# Patient Record
Sex: Female | Born: 1975
Health system: Southern US, Community
[De-identification: ages and names within clinical notes are randomized; demographics above are authoritative.]

## PROBLEM LIST (undated history)

## (undated) DIAGNOSIS — D219 Benign neoplasm of connective and other soft tissue, unspecified: Secondary | ICD-10-CM

## (undated) DIAGNOSIS — O139 Gestational [pregnancy-induced] hypertension without significant proteinuria, unspecified trimester: Secondary | ICD-10-CM

## (undated) DIAGNOSIS — D649 Anemia, unspecified: Secondary | ICD-10-CM

## (undated) DIAGNOSIS — R569 Unspecified convulsions: Secondary | ICD-10-CM

## (undated) HISTORY — PX: TONSILLECTOMY: SUR1361

---

## 2006-07-22 ENCOUNTER — Emergency Department (HOSPITAL_COMMUNITY): Admission: EM | Admit: 2006-07-22 | Discharge: 2006-07-22 | Payer: Self-pay | Admitting: Emergency Medicine

## 2006-09-13 ENCOUNTER — Emergency Department (HOSPITAL_COMMUNITY): Admission: EM | Admit: 2006-09-13 | Discharge: 2006-09-13 | Payer: Self-pay | Admitting: Emergency Medicine

## 2006-12-13 ENCOUNTER — Emergency Department (HOSPITAL_COMMUNITY): Admission: EM | Admit: 2006-12-13 | Discharge: 2006-12-13 | Payer: Self-pay | Admitting: Emergency Medicine

## 2007-02-04 ENCOUNTER — Inpatient Hospital Stay (HOSPITAL_COMMUNITY): Admission: AD | Admit: 2007-02-04 | Discharge: 2007-02-06 | Payer: Self-pay | Admitting: Obstetrics & Gynecology

## 2007-03-18 ENCOUNTER — Emergency Department (HOSPITAL_COMMUNITY): Admission: EM | Admit: 2007-03-18 | Discharge: 2007-03-18 | Payer: Self-pay | Admitting: Emergency Medicine

## 2008-02-09 ENCOUNTER — Emergency Department (HOSPITAL_COMMUNITY): Admission: EM | Admit: 2008-02-09 | Discharge: 2008-02-09 | Payer: Self-pay | Admitting: Emergency Medicine

## 2009-05-16 ENCOUNTER — Inpatient Hospital Stay (HOSPITAL_COMMUNITY): Admission: AD | Admit: 2009-05-16 | Discharge: 2009-05-17 | Payer: Self-pay | Admitting: Obstetrics and Gynecology

## 2009-07-28 ENCOUNTER — Inpatient Hospital Stay (HOSPITAL_COMMUNITY): Admission: AD | Admit: 2009-07-28 | Discharge: 2009-08-01 | Payer: Self-pay | Admitting: Obstetrics & Gynecology

## 2009-09-07 ENCOUNTER — Inpatient Hospital Stay (HOSPITAL_COMMUNITY): Admission: RE | Admit: 2009-09-07 | Discharge: 2009-09-09 | Payer: Self-pay | Admitting: Obstetrics and Gynecology

## 2009-09-07 ENCOUNTER — Encounter (INDEPENDENT_AMBULATORY_CARE_PROVIDER_SITE_OTHER): Payer: Self-pay | Admitting: Obstetrics and Gynecology

## 2010-04-09 ENCOUNTER — Other Ambulatory Visit: Payer: Self-pay | Admitting: Obstetrics and Gynecology

## 2010-04-26 NOTE — Discharge Summary (Signed)
  NAMECRESCENTIA, [name]           ACCOUNT NO.:  [id number]  MEDICAL RECORD NO.:  [id number]         PATIENT TYPE:  WINP  LOCATION:                                FACILITY:  [facility]  PHYSICIAN:  Malva Limes, M.D.    DATE OF BIRTH:  1975-04-20  DATE OF ADMISSION: DATE OF DISCHARGE:                              DISCHARGE SUMMARY   PRINCIPLE DISCHARGE DIAGNOSES: 1. Intrauterine pregnancy at 30 weeks estimated gestational age. 2. Bleeding placenta previa. 3. Seizure disorder.  PRINCIPAL PROCEDURES: 1. Neurology consult. 2. Magnesium sulfate tocolysis.  HISTORY OF PRESENT ILLNESS:  Jo [name] is a 35 year old black female G3, P 2-0-0 2 at 30 weeks who was brought in by EMS complaining of vaginal bleeding.  The patient had a known complete posterior previa.  On admission the patient had no heavy bleeding.  She was admitted and given betamethasone.  During the admission, the patient was noted to have a seizure.  She did have a known seizure disorder, had been off of medications since October 2010.  The patient did have a Neurology consult while being hospitalized.  The patient was discharged to home on hospital day #5, at that time she has had no bleeding for several days. She is currently on Procardia 10 mg p.o. q.6 h.  She was instructed to be on bedrest and call the office with any bleeding.  She is to return to the office in 1 week.          ______________________________ Malva Limes, M.D.     MA/MEDQ  D:  04/19/2010  T:  04/20/2010  Job:  [id number]  Electronically Signed by Malva Limes M.D. on 04/26/2010 12:35:20 PM

## 2010-04-26 NOTE — Discharge Summary (Signed)
  [name], Jo [name]           ACCOUNT NO.:  [id number]  MEDICAL RECORD NO.:  [id number]          PATIENT TYPE:  INP  LOCATION:  9136                          FACILITY:  [facility]  PHYSICIAN:  Malva Limes, M.D.    DATE OF BIRTH:  03/11/1975  DATE OF ADMISSION:  09/07/2009 DATE OF DISCHARGE:  09/09/2009                              DISCHARGE SUMMARY   PRINCIPAL DISCHARGE DIAGNOSES: 1. Intrauterine pregnancy at 25 weeks' estimated gestational age. 2. Complete posterior placenta previa. 3. Homero Fellers breech presentation. 4. Vaginal bleeding. 5. Seizure.  PRINCIPAL PROCEDURES: 1. Magnesium tocolysis. 2. Neuro consult. 3. Bedrest. 4. Administration of betamethasone.  HISTORY OF PRESENT ILLNESS:  [name] is a 35 year old African American female G3, P2-0-0-2 at 30-1/2 weeks' estimated gestational age who presented complaining of painless vaginal bleeding which began approximately 2:30 a.m.  The patient stated that she had a big gush of blood noticed in the toilet.  The patient was brought to the hospital by EMS.  In the emergency room, the patient was noted to have small amount of bleeding.  One week prior to hospitalization, she did have an ultrasound at which time she was noted to still have a placenta previa. The patient was admitted and given betamethasone.  On July 28, 2009, at 7 a.m., the patient's husband noticed a seizure.  The patient does have a history of seizure disorder, had been off her Topamax since October 2010.  The patient did have a neuro consult while in the hospital and it was not recommended that the patient restarted Topamax.  The patient was observed until August 01, 2009, at which time, she had no vaginal bleeding for several days and no contractions.  The patient had a strong desire to go home; therefore, she was discharged to home.  She was instructed to followup in the office in 1 week.  She was sent home with Procardia 10 mg p.o. 6 h. and told to have bedrest  with bathroom privileges only.          ______________________________ Malva Limes, M.D.     MA/MEDQ  D:  09/14/2009  T:  09/15/2009  Job:  [id number]  Electronically Signed by Malva Limes M.D. on 04/26/2010 12:35:18 PM

## 2010-05-12 LAB — CBC
HCT: 24.5 % — ABNORMAL LOW (ref 36.0–46.0)
MCH: 32 pg (ref 26.0–34.0)
MCHC: 34 g/dL (ref 30.0–36.0)
MCV: 94.1 fL (ref 78.0–100.0)
WBC: 13.1 10*3/uL — ABNORMAL HIGH (ref 4.0–10.5)

## 2010-05-13 LAB — CROSSMATCH
ABO/RH(D): O POS
Antibody Screen: NEGATIVE

## 2010-05-13 LAB — BASIC METABOLIC PANEL
CO2: 23 mEq/L (ref 19–32)
Creatinine, Ser: 0.73 mg/dL (ref 0.4–1.2)
GFR calc non Af Amer: >60 mL/min (ref >60)
Glucose, Bld: 78 mg/dL (ref 70–99)
Sodium: 134 mEq/L — ABNORMAL LOW (ref 135–145)

## 2010-05-13 LAB — CBC
HCT: 34.3 % — ABNORMAL LOW (ref 36.0–46.0)
Hemoglobin: 11.3 g/dL — ABNORMAL LOW (ref 12.0–15.0)
MCV: 94.4 fL (ref 78.0–100.0)
Platelets: 233 10*3/uL (ref 150–400)
RBC: 3.63 MIL/uL — ABNORMAL LOW (ref 3.87–5.11)
RDW: 14.7 % (ref 11.5–15.5)
WBC: 8 10*3/uL (ref 4.0–10.5)

## 2010-05-13 LAB — RPR: RPR Ser Ql: NONREACTIVE

## 2010-05-13 LAB — URINALYSIS, ROUTINE W REFLEX MICROSCOPIC
Bilirubin Urine: NEGATIVE
Nitrite: NEGATIVE
Specific Gravity, Urine: 1.015 (ref 1.005–1.030)

## 2010-05-13 LAB — SURGICAL PCR SCREEN
MRSA, PCR: NEGATIVE
Staphylococcus aureus: NEGATIVE

## 2010-05-14 LAB — PREPARE RBC (CROSSMATCH)

## 2010-05-14 LAB — COMPREHENSIVE METABOLIC PANEL
AST: 14 U/L (ref 0–37)
Alkaline Phosphatase: 43 U/L (ref 39–117)
CO2: 20 mEq/L (ref 19–32)
GFR calc Af Amer: >60 mL/min (ref >60)
GFR calc non Af Amer: >60 mL/min (ref >60)
Glucose, Bld: 89 mg/dL (ref 70–99)
Total Bilirubin: 0.3 mg/dL (ref 0.3–1.2)
Total Protein: 4.8 g/dL — ABNORMAL LOW (ref 6.0–8.3)

## 2010-05-14 LAB — TYPE AND SCREEN: Antibody Screen: NEGATIVE

## 2010-05-14 LAB — URINALYSIS, DIPSTICK ONLY
Bilirubin Urine: NEGATIVE
Glucose, UA: NEGATIVE mg/dL
Nitrite: NEGATIVE
Protein, ur: NEGATIVE mg/dL
Specific Gravity, Urine: 1.025 (ref 1.005–1.030)
pH: 6 (ref 5.0–8.0)

## 2010-05-14 LAB — CBC
Hemoglobin: 9.9 g/dL — ABNORMAL LOW (ref 12.0–15.0)
MCHC: 34.7 g/dL (ref 30.0–36.0)
MCV: 94 fL (ref 78.0–100.0)
RBC: 3.09 MIL/uL — ABNORMAL LOW (ref 3.87–5.11)
RBC: 3.14 MIL/uL — ABNORMAL LOW (ref 3.87–5.11)
RDW: 14.7 % (ref 11.5–15.5)
WBC: 11.6 10*3/uL — ABNORMAL HIGH (ref 4.0–10.5)
WBC: 7.5 10*3/uL (ref 4.0–10.5)

## 2010-05-14 LAB — ABO/RH: ABO/RH(D): O POS

## 2010-05-20 LAB — COMPREHENSIVE METABOLIC PANEL
ALT: 20 U/L (ref 0–35)
AST: 26 U/L (ref 0–37)
Albumin: 2.6 g/dL — ABNORMAL LOW (ref 3.5–5.2)
Alkaline Phosphatase: 55 U/L (ref 39–117)
BUN: 9 mg/dL (ref 6–23)
CO2: 21 mEq/L (ref 19–32)
Calcium: 8.3 mg/dL — ABNORMAL LOW (ref 8.4–10.5)
Calcium: 8.8 mg/dL (ref 8.4–10.5)
Creatinine, Ser: 0.7 mg/dL (ref 0.4–1.2)
GFR calc Af Amer: >60 mL/min (ref >60)
GFR calc Af Amer: >60 mL/min (ref >60)
Glucose, Bld: 82 mg/dL (ref 70–99)
Potassium: 4 mEq/L (ref 3.5–5.1)
Sodium: 135 mEq/L (ref 135–145)
Total Protein: 5.9 g/dL — ABNORMAL LOW (ref 6.0–8.3)

## 2010-05-20 LAB — URINALYSIS, ROUTINE W REFLEX MICROSCOPIC
Glucose, UA: NEGATIVE mg/dL
Ketones, ur: >80 mg/dL — AB
Leukocytes, UA: NEGATIVE
Protein, ur: 30 mg/dL — AB
Urobilinogen, UA: 0.2 mg/dL (ref 0.0–1.0)

## 2010-05-20 LAB — OVA AND PARASITE EXAMINATION: Ova and parasites: NONE SEEN

## 2010-05-20 LAB — DIFFERENTIAL
Basophils Absolute: 0 10*3/uL (ref 0.0–0.1)
Basophils Relative: 0 % (ref 0–1)
Eosinophils Relative: 0 % (ref 0–5)
Monocytes Relative: 1 % — ABNORMAL LOW (ref 3–12)
Neutrophils Relative %: 94 % — ABNORMAL HIGH (ref 43–77)

## 2010-05-20 LAB — CLOSTRIDIUM DIFFICILE EIA: C difficile Toxins A+B, EIA: 23

## 2010-05-20 LAB — STOOL CULTURE

## 2010-05-20 LAB — CBC
HCT: 40.3 % (ref 36.0–46.0)
Hemoglobin: 13.1 g/dL (ref 12.0–15.0)
MCHC: 32.6 g/dL (ref 30.0–36.0)
MCHC: 34.2 g/dL (ref 30.0–36.0)
Platelets: 168 10*3/uL (ref 150–400)
RBC: 4.6 MIL/uL (ref 3.87–5.11)
RDW: 18.5 % — ABNORMAL HIGH (ref 11.5–15.5)
RDW: 18.5 % — ABNORMAL HIGH (ref 11.5–15.5)

## 2010-05-20 LAB — LACTATE DEHYDROGENASE: LDH: 91 U/L — ABNORMAL LOW (ref 94–250)

## 2010-05-31 NOTE — Discharge Summary (Signed)
  NAMEJulien Girt            ACCOUNT NO.:  [id number]  MEDICAL RECORD NO.:  [id number]           PATIENT TYPE:  LOCATION:                                 FACILITY:  PHYSICIAN:  Malva Limes, M.D.         DATE OF BIRTH:  DATE OF ADMISSION:  09/07/2009 DATE OF DISCHARGE:  09/09/2009                              DISCHARGE SUMMARY   PRINCIPAL DISCHARGE DIAGNOSES: 1. Intrauterine pregnancy at term. 2. Complete placenta previa.  PRINCIPAL PROCEDURES:  Primary low transverse cesarean section.  HOSPITAL COURSE:  Ms. Jo [name] is a 35 year old black female, G3, P 2-0- 0-2 at 54 and 1 day estimated gestational age, who was admitted by Dr. Conley Simmonds for primary cesarean section secondary to placenta previa with 2 previous bleeding episodes.  The patient did receive betamethasone after the second bleeding episode.  In light of this, it was decided to proceed with the cesarean section at an earlier time. The patient underwent a cesarean section on September 07, 2009.  A complete description of this can be found in dictated operative note.  On the evening of the cesarean section, there is a note by the nursing service stating that the patient had a 30-second seizure that states that the physician was called, however, there is no indication that the patient was evaluated or any orders were given.  On postop day #1, the patient had no complaints.  She had normal lochia.  She was ambulate without difficulty.  The patient asked to go home on postoperative day #2.  She was discharged to home.  At that time, her incision was healing well. She was ambulating without difficulty.  She had no further seizure activity.  She was discharged to home with iron and Percocet.  She was instructed to follow up in the office in 4 weeks.  I am unaware of why it took 10 months to get this chart for dictation.  The patient was also instructed to follow up with a neurologist.     ______________________________ Malva Limes, M.D.     MA/MEDQ  D:  05/19/2010  T:  05/20/2010  Job:  [id number]  Electronically Signed by Malva Limes M.D. on 05/31/2010 08:52:46 AM

## 2010-07-10 NOTE — Discharge Summary (Signed)
NAMEKAMRI, GOTSCH           ACCOUNT NO.:  [id number]   MEDICAL RECORD NO.:  [id number]          PATIENT TYPE:  INP   LOCATION:  9111                          FACILITY:  [facility]   PHYSICIAN:  Gerrit Friends. Aldona Bar, M.D.   DATE OF BIRTH:  03/06/1975   DATE OF ADMISSION:  02/04/2007  DATE OF DISCHARGE:  02/06/2007                               DISCHARGE SUMMARY   DISCHARGE DIAGNOSES:  1. Term pregnancy, delivered a 5 pound, 10 ounce female infant, Apgars 9      and 9.  2. Positive group B strep antenatally.  3. History of migraine-induced seizures - maintained on Topamax  4. Blood type is O+.   PROCEDURES:  1. Normal spontaneous delivery.  2. Superficial tears and repair.   SUMMARY:  This 35 year old gravida 2, now para 2 was admitted at term in  labor.  On the day prior to admission, in the office her pressure was  noted to be slightly elevated.  Labs at that time were within normal  limits.  After presentation at approximately 3:00 a.m. on the morning of  December 10, she was noted to be progressing rapidly.  She received  intravenous ampicillin for group B strep prophylaxis and after membranes  were ruptured progressed to full dilatation rather rapidly and delivered  a viable 5 pound, 10 ounce female infant with Apgars of 9 and 9.  There  was a small supraurethral laceration which was repaired without  difficulty, and the mother's postpartum course otherwise was  unremarkable.  Her postpartum blood pressure was normal.  Her postpartum  hemoglobin was 10.8 with a white count of 13,400, platelet count of  169,000.  On the morning of December 12, she was ambulating well,  tolerating a regular diet well, having normal bowel and bladder  function, was afebrile, was requiring only minimal ibuprofen for  discomfort, was breast-feeding without difficulty and was desirous of  discharge.  Accordingly, she was given all appropriate instructions and  understood all instructions well.  Discharge  medications include  vitamins - one a day as long she is breast-feeding and she will use  Feosol capsules - one daily until her followup visit in the office four  weeks hence.  She will also continue her Topamax 25 mg twice daily for  seizure prophylaxis secondary to her migraine headaches per her  neurologist.   CONDITION ON DISCHARGE:  Improved.      Gerrit Friends. Aldona Bar, M.D.  Electronically Signed     RMW/MEDQ  D:  02/06/2007  T:  02/06/2007  Job:  [id number]

## 2010-11-15 LAB — I-STAT 8, (EC8 V) (CONVERTED LAB)
Acid-base deficit: 3 — ABNORMAL HIGH
Acid-base deficit: 3 — ABNORMAL HIGH
BUN: 6
BUN: 6
Bicarbonate: 20.9
Bicarbonate: 21.6
Chloride: 110
Chloride: 110
Glucose, Bld: 100 — ABNORMAL HIGH
Glucose, Bld: 99
HCT: 40
HCT: 40
Hemoglobin: 13.6
Hemoglobin: 13.6
Operator id: 146091
Operator id: 146091
Potassium: 4
Potassium: 4
Sodium: 137
Sodium: 137
TCO2: 22
TCO2: 23
pCO2, Ven: 34 — ABNORMAL LOW
pCO2, Ven: 35.2 — ABNORMAL LOW
pH, Ven: 7.396 — ABNORMAL HIGH
pH, Ven: 7.396 — ABNORMAL HIGH

## 2010-11-15 LAB — URINALYSIS, ROUTINE W REFLEX MICROSCOPIC
Glucose, UA: NEGATIVE
Leukocytes, UA: NEGATIVE
Specific Gravity, Urine: 1.03
pH: 6.5

## 2010-11-15 LAB — DIFFERENTIAL
Basophils Absolute: 0
Basophils Relative: 0
Eosinophils Absolute: 0.1
Eosinophils Relative: 2
Lymphocytes Relative: 8 — ABNORMAL LOW
Lymphs Abs: 0.6 — ABNORMAL LOW
Monocytes Absolute: 0.3
Monocytes Relative: 4
Neutro Abs: 7.2
Neutrophils Relative %: 87 — ABNORMAL HIGH

## 2010-11-15 LAB — URINE MICROSCOPIC-ADD ON

## 2010-11-15 LAB — CULTURE, BLOOD (ROUTINE X 2)
Culture: NO GROWTH
Culture: NO GROWTH

## 2010-11-15 LAB — POCT I-STAT CREATININE
Creatinine, Ser: 1
Operator id: 146091

## 2010-11-15 LAB — CBC
MCHC: 33.5
RBC: 4.2
WBC: 8.3

## 2010-12-03 LAB — COMPREHENSIVE METABOLIC PANEL
Alkaline Phosphatase: 110
BUN: 7
CO2: 23
GFR calc non Af Amer: >60
Glucose, Bld: 88
Potassium: 4.1
Total Protein: 6.5

## 2010-12-03 LAB — URINALYSIS, ROUTINE W REFLEX MICROSCOPIC
Bilirubin Urine: NEGATIVE
Glucose, UA: NEGATIVE
Hgb urine dipstick: NEGATIVE
Ketones, ur: NEGATIVE
Protein, ur: NEGATIVE
Urobilinogen, UA: 0.2

## 2010-12-03 LAB — CBC
HCT: 31.1 — ABNORMAL LOW
HCT: 38.1
Hemoglobin: 13.1
MCHC: 34.3
MCHC: 34.6
MCV: 90.9
Platelets: 169
RBC: 4.21
RDW: 13.5
RDW: 13.7
WBC: 13.4 — ABNORMAL HIGH

## 2010-12-03 LAB — RPR: RPR Ser Ql: NONREACTIVE

## 2010-12-03 LAB — LACTATE DEHYDROGENASE: LDH: 135

## 2010-12-05 LAB — COMPREHENSIVE METABOLIC PANEL
ALT: 12
Alkaline Phosphatase: 67
BUN: 7
CO2: 20
Chloride: 111
GFR calc non Af Amer: >60
Glucose, Bld: 115 — ABNORMAL HIGH
Potassium: 3.9
Sodium: 138
Total Bilirubin: 0.4

## 2010-12-05 LAB — URINALYSIS, ROUTINE W REFLEX MICROSCOPIC
Bilirubin Urine: NEGATIVE
Ketones, ur: 15 — AB
Leukocytes, UA: NEGATIVE
Nitrite: NEGATIVE
Protein, ur: 30 — AB

## 2010-12-05 LAB — CBC
HCT: 34.2 — ABNORMAL LOW
Hemoglobin: 11.7 — ABNORMAL LOW
MCHC: 34.2
RBC: 3.75 — ABNORMAL LOW
RDW: 13.2

## 2010-12-05 LAB — DIFFERENTIAL
Basophils Absolute: 0
Basophils Relative: 0
Eosinophils Absolute: 0.1
Monocytes Relative: 5
Neutro Abs: 5.1
Neutrophils Relative %: 71

## 2010-12-05 LAB — URINE MICROSCOPIC-ADD ON

## 2010-12-10 LAB — I-STAT 8, (EC8 V) (CONVERTED LAB)
Acid-base deficit: 6 — ABNORMAL HIGH
Bicarbonate: 18.6 — ABNORMAL LOW
Glucose, Bld: 83
Hemoglobin: 11.9 — ABNORMAL LOW
Operator id: 161631
Sodium: 135
TCO2: 20

## 2011-04-24 ENCOUNTER — Other Ambulatory Visit: Payer: Self-pay | Admitting: Obstetrics and Gynecology

## 2011-04-26 ENCOUNTER — Emergency Department (HOSPITAL_COMMUNITY): Payer: 59

## 2011-04-26 ENCOUNTER — Encounter (HOSPITAL_COMMUNITY): Payer: Self-pay

## 2011-04-26 ENCOUNTER — Emergency Department (HOSPITAL_COMMUNITY)
Admission: EM | Admit: 2011-04-26 | Discharge: 2011-04-26 | Disposition: A | Discharge status: Home / Self Care | Payer: 59 | Attending: Emergency Medicine | Admitting: Emergency Medicine

## 2011-04-26 DIAGNOSIS — M549 Dorsalgia, unspecified: Secondary | ICD-10-CM | POA: Insufficient documentation

## 2011-04-26 DIAGNOSIS — R10819 Abdominal tenderness, unspecified site: Secondary | ICD-10-CM | POA: Insufficient documentation

## 2011-04-26 DIAGNOSIS — R109 Unspecified abdominal pain: Secondary | ICD-10-CM | POA: Insufficient documentation

## 2011-04-26 DIAGNOSIS — Z331 Pregnant state, incidental: Secondary | ICD-10-CM | POA: Insufficient documentation

## 2011-04-26 DIAGNOSIS — O219 Vomiting of pregnancy, unspecified: Secondary | ICD-10-CM | POA: Insufficient documentation

## 2011-04-26 DIAGNOSIS — N898 Other specified noninflammatory disorders of vagina: Secondary | ICD-10-CM | POA: Insufficient documentation

## 2011-04-26 DIAGNOSIS — IMO0002 Reserved for concepts with insufficient information to code with codable children: Secondary | ICD-10-CM | POA: Insufficient documentation

## 2011-04-26 LAB — COMPREHENSIVE METABOLIC PANEL
BUN: 8 mg/dL (ref 6–23)
CO2: 26 mEq/L (ref 19–32)
Chloride: 104 mEq/L (ref 96–112)
Creatinine, Ser: 0.75 mg/dL (ref 0.50–1.10)
GFR calc Af Amer: >90 mL/min (ref >90)
GFR calc non Af Amer: >90 mL/min (ref >90)
Total Bilirubin: 0.3 mg/dL (ref 0.3–1.2)

## 2011-04-26 LAB — DIFFERENTIAL
Lymphocytes Relative: 27 % (ref 12–46)
Monocytes Absolute: 0.3 10*3/uL (ref 0.1–1.0)
Monocytes Relative: 5 % (ref 3–12)
Neutro Abs: 4.1 10*3/uL (ref 1.7–7.7)

## 2011-04-26 LAB — WET PREP, GENITAL: Clue Cells Wet Prep HPF POC: NONE SEEN

## 2011-04-26 LAB — URINALYSIS, ROUTINE W REFLEX MICROSCOPIC
Glucose, UA: NEGATIVE mg/dL
Hgb urine dipstick: NEGATIVE
Leukocytes, UA: NEGATIVE
pH: 5.5 (ref 5.0–8.0)

## 2011-04-26 LAB — CBC
HCT: 35.3 % — ABNORMAL LOW (ref 36.0–46.0)
Hemoglobin: 11.5 g/dL — ABNORMAL LOW (ref 12.0–15.0)
MCHC: 32.6 g/dL (ref 30.0–36.0)
MCV: 83.3 fL (ref 78.0–100.0)
WBC: 6.1 10*3/uL (ref 4.0–10.5)

## 2011-04-26 LAB — PREGNANCY, URINE: Preg Test, Ur: POSITIVE — AB

## 2011-04-26 LAB — LIPASE, BLOOD: Lipase: 38 U/L (ref 11–59)

## 2011-04-26 MED ORDER — OXYCODONE-ACETAMINOPHEN 5-325 MG PO TABS: 1.0000 | ORAL_TABLET | Freq: Four times a day (QID) | ORAL | Status: AC | PRN

## 2011-04-26 MED ORDER — ONDANSETRON HCL 4 MG PO TABS: 4.0000 mg | ORAL_TABLET | Freq: Four times a day (QID) | ORAL | Status: AC

## 2011-04-26 MED ORDER — SODIUM CHLORIDE 0.9 % IV BOLUS (SEPSIS)
1000.0000 mL | Freq: Once | INTRAVENOUS | Status: AC
  Administered 2011-04-26: 1000 mL via INTRAVENOUS

## 2011-04-26 MED ORDER — HYDROMORPHONE HCL PF 1 MG/ML IJ SOLN
1.0000 mg | Freq: Once | INTRAMUSCULAR | Status: AC
  Administered 2011-04-26: 1 mg via INTRAVENOUS
  Filled 2011-04-26: qty 1

## 2011-04-26 MED ORDER — PRENATAL COMPLETE 14-0.4 MG PO TABS: 1.0000 | ORAL_TABLET | Freq: Every day | ORAL | Status: DC

## 2011-04-26 MED ORDER — ONDANSETRON HCL 4 MG/2ML IJ SOLN
4.0000 mg | Freq: Once | INTRAMUSCULAR | Status: AC
  Administered 2011-04-26: 4 mg via INTRAVENOUS
  Filled 2011-04-26: qty 2

## 2011-04-26 NOTE — ED Notes (Signed)
Pt complians of generalized abd pain that started about 1130am,  She's speaking very quietly in triage. She states that she tried to eat something and vomited

## 2011-04-26 NOTE — Discharge Instructions (Signed)
ABCs of Pregnancy A Antepartum care is very important. Be sure you see your doctor and get prenatal care as soon as you think you are pregnant. At this time, you will be tested for infection, genetic abnormalities and potential problems with you and the pregnancy. This is the time to discuss diet, exercise, work, medications, labor, pain medication during labor and the possibility of a cesarean delivery. Ask any questions that may concern you. It is important to see your doctor regularly throughout your pregnancy. Avoid exposure to toxic substances and chemicals - such as cleaning solvents, lead and mercury, some insecticides, and paint. Pregnant women should avoid exposure to paint fumes, and fumes that cause you to feel ill, dizzy or faint. When possible, it is a good idea to have a pre-pregnancy consultation with your caregiver to begin some important recommendations your caregiver suggests such as, taking folic acid, exercising, quitting smoking, avoiding alcoholic beverages, etc. B Breastfeeding is the healthiest choice for both you and your baby. It has many nutritional benefits for the baby and health benefits for the mother. It also creates a very tight and loving bond between the baby and mother. Talk to your doctor, your family and friends, and your employer about how you choose to feed your baby and how they can support you in your decision. Not all birth defects can be prevented, but a woman can take actions that may increase her chance of having a healthy baby. Many birth defects happen very early in pregnancy, sometimes before a woman even knows she is pregnant. Birth defects or abnormalities of any child in your or the father's family should be discussed with your caregiver. Get a good support bra as your breast size changes. Wear it especially when you exercise and when nursing.  C Celebrate the news of your pregnancy with the your spouse/father and family. Childbirth classes are helpful to  take for you and the spouse/father because it helps to understand what happens during the pregnancy, labor and delivery. Cesarean delivery should be discussed with your doctor so you are prepared for that possibility. The pros and cons of circumcision if it is a boy, should be discussed with your pediatrician. Cigarette smoking during pregnancy can result in low birth weight babies. It has been associated with infertility, miscarriages, tubal pregnancies, infant death (mortality) and poor health (morbidity) in childhood. Additionally, cigarette smoking may cause long-term learning disabilities. If you smoke, you should try to quit before getting pregnant and not smoke during the pregnancy. Secondary smoke may also harm a mother and her developing baby. It is a good idea to ask people to stop smoking around you during your pregnancy and after the baby is born. Extra calcium is necessary when you are pregnant and is found in your prenatal vitamin, in dairy products, green leafy vegetables and in calcium supplements. D A healthy diet according to your current weight and height, along with vitamins and mineral supplements should be discussed with your caregiver. Domestic abuse or violence should be made known to your doctor right away to get the situation corrected. Drink more water when you exercise to keep hydrated. Discomfort of your back and legs usually develops and progresses from the middle of the second trimester through to delivery of the baby. This is because of the enlarging baby and uterus, which may also affect your balance. Do not take illegal drugs. Illegal drugs can seriously harm the baby and you. Drink extra fluids (water is best) throughout pregnancy to help  your body keep up with the increases in your blood volume. Drink at least 6 to 8 glasses of water, fruit juice, or milk each day. A good way to know you are drinking enough fluid is when your urine looks almost like clear water or is very light  yellow.  E Eat healthy to get the nutrients you and your unborn baby need. Your meals should include the five basic food groups. Exercise (30 minutes of light to moderate exercise a day) is important and encouraged during pregnancy, if there are no medical problems or problems with the pregnancy. Exercise that causes discomfort or dizziness should be stopped and reported to your caregiver. Emotions during pregnancy can change from being ecstatic to depression and should be understood by you, your partner and your family. F Fetal screening with ultrasound, amniocentesis and monitoring during pregnancy and labor is common and sometimes necessary. Take 400 micrograms of folic acid daily both before, when possible, and during the first few months of pregnancy to reduce the risk of birth defects of the brain and spine. All women who could possibly become pregnant should take a vitamin with folic acid, every day. It is also important to eat a healthy diet with fortified foods (enriched grain products, including cereals, rice, breads, and pastas) and foods with natural sources of folate (orange juice, green leafy vegetables, beans, peanuts, broccoli, asparagus, peas, and lentils). The father should be involved with all aspects of the pregnancy including, the prenatal care, childbirth classes, labor, delivery, and postpartum time. Fathers may also have emotional concerns about being a father, financial needs, and raising a family. G Genetic testing should be done appropriately. It is important to know your family and the father's history. If there have been problems with pregnancies or birth defects in your family, report these to your doctor. Also, genetic counselors can talk with you about the information you might need in making decisions about having a family. You can call a major medical center in your area for help in finding a board-certified genetic counselor. Genetic testing and counseling should be done  before pregnancy when possible, especially if there is a history of problems in the mother's or father's family. Certain ethnic backgrounds are more at risk for genetic defects. H Get familiar with the hospital where you will be having your baby. Get to know how long it takes to get there, the labor and delivery area, and the hospital procedures. Be sure your medical insurance is accepted there. Get your home ready for the baby including, clothes, the baby's room (when possible), furniture and car seat. Hand washing is important throughout the day, especially after handling raw meat and poultry, changing the baby's diaper or using the bathroom. This can help prevent the spread of many bacteria and viruses that cause infection. Your hair may become dry and thinner, but will return to normal a few weeks after the baby is born. Heartburn is a common problem that can be treated by taking antacids recommended by your caregiver, eating smaller meals 5 or 6 times a day, not drinking liquids when eating, drinking between meals and raising the head of your bed 2 to 3 inches. I Insurance to cover you, the baby, doctor and hospital should be reviewed so that you will be prepared to pay any costs not covered by your insurance plan. If you do not have medical insurance, there are usually clinics and services available for you in your community. Take 30 milligrams of iron during  your pregnancy as prescribed by your doctor to reduce the risk of low red blood cells (anemia) later in pregnancy. All women of childbearing age should eat a diet rich in iron. J There should be a joint effort for the mother, father and any other children to adapt to the pregnancy financially, emotionally, and psychologically during the pregnancy. Join a support group for moms-to-be. Or, join a class on parenting or childbirth. Have the family participate when possible. K Know your limits. Let your caregiver know if you experience any of the  following:   Pain of any kind.   Strong cramps.   You develop a lot of weight in a short period of time (5 pounds in 3 to 5 days).   Vaginal bleeding, leaking of amniotic fluid.   Headache, vision problems.   Dizziness, fainting, shortness of breath.   Chest pain.   Fever of 102 F (38.9 C) or higher.   Gush of clear fluid from your vagina.   Painful urination.   Domestic violence.   Irregular heartbeat (palpitations).   Rapid beating of the heart (tachycardia).   Constant feeling sick to your stomach (nauseous) and vomiting.   Trouble walking, fluid retention (edema).   Muscle weakness.   If your baby has decreased activity.   Persistent diarrhea.   Abnormal vaginal discharge.   Uterine contractions at 20-minute intervals.   Back pain that travels down your leg.  L Learn and practice that what you eat and drink should be in moderation and healthy for you and your baby. Legal drugs such as alcohol and caffeine are important issues for pregnant women. There is no safe amount of alcohol a woman can drink while pregnant. Fetal alcohol syndrome, a disorder characterized by growth retardation, facial abnormalities, and central nervous system dysfunction, is caused by a woman's use of alcohol during pregnancy. Caffeine, found in tea, coffee, soft drinks and chocolate, should also be limited. Be sure to read labels when trying to cut down on caffeine during pregnancy. More than 200 foods, beverages, and over-the-counter medications contain caffeine and have a high salt content! There are coffees and teas that do not contain caffeine. M Medical conditions such as diabetes, epilepsy, and high blood pressure should be treated and kept under control before pregnancy when possible, but especially during pregnancy. Ask your caregiver about any medications that may need to be changed or adjusted during pregnancy. If you are currently taking any medications, ask your caregiver if it  is safe to take them while you are pregnant or before getting pregnant when possible. Also, be sure to discuss any herbs or vitamins you are taking. They are medicines, too! Discuss with your doctor all medications, prescribed and over-the-counter, that you are taking. During your prenatal visit, discuss the medications your doctor may give you during labor and delivery. N Never be afraid to ask your doctor or caregiver questions about your health, the progress of the pregnancy, family problems, stressful situations, and recommendation for a pediatrician, if you do not have one. It is better to take all precautions and discuss any questions or concerns you may have during your office visits. It is a good idea to write down your questions before you visit the doctor. O Over-the-counter cough and cold remedies may contain alcohol or other ingredients that should be avoided during pregnancy. Ask your caregiver about prescription, herbs or over-the-counter medications that you are taking or may consider taking while pregnant.  P Physical activity during pregnancy can  benefit both you and your baby by lessening discomfort and fatigue, providing a sense of well-being, and increasing the likelihood of early recovery after delivery. Light to moderate exercise during pregnancy strengthens the belly (abdominal) and back muscles. This helps improve posture. Practicing yoga, walking, swimming, and cycling on a stationary bicycle are usually safe exercises for pregnant women. Avoid scuba diving, exercise at high altitudes (over 3000 feet), skiing, horseback riding, contact sports, etc. Always check with your doctor before beginning any kind of exercise, especially during pregnancy and especially if you did not exercise before getting pregnant. Q Queasiness, stomach upset and morning sickness are common during pregnancy. Eating a couple of crackers or dry toast before getting out of bed. Foods that you normally love may  make you feel sick to your stomach. You may need to substitute other nutritious foods. Eating 5 or 6 small meals a day instead of 3 large ones may make you feel better. Do not drink with your meals, drink between meals. Questions that you have should be written down and asked during your prenatal visits. R Read about and make plans to baby-proof your home. There are important tips for making your home a safer environment for your baby. Review the tips and make your home safer for you and your baby. Read food labels regarding calories, salt and fat content in the food. S Saunas, hot tubs, and steam rooms should be avoided while you are pregnant. Excessive high heat may be harmful during your pregnancy. Your caregiver will screen and examine you for sexually transmitted diseases and genetic disorders during your prenatal visits. Learn the signs of labor. Sexual relations while pregnant is safe unless there is a medical or pregnancy problem and your caregiver advises against it. T Traveling long distances should be avoided especially in the third trimester of your pregnancy. If you do have to travel out of state, be sure to take a copy of your medical records and medical insurance plan with you. You should not travel long distances without seeing your doctor first. Most airlines will not allow you to travel after 36 weeks of pregnancy. Toxoplasmosis is an infection caused by a parasite that can seriously harm an unborn baby. Avoid eating undercooked meat and handling cat litter. Be sure to wear gloves when gardening. Tingling of the hands and fingers is not unusual and is due to fluid retention. This will go away after the baby is born. U Womb (uterus) size increases during the first trimester. Your kidneys will begin to function more efficiently. This may cause you to feel the need to urinate more often. You may also leak urine when sneezing, coughing or laughing. This is due to the growing uterus pressing  against your bladder, which lies directly in front of and slightly under the uterus during the first few months of pregnancy. If you experience burning along with frequency of urination or bloody urine, be sure to tell your doctor. The size of your uterus in the third trimester may cause a problem with your balance. It is advisable to maintain good posture and avoid wearing high heels during this time. An ultrasound of your baby may be necessary during your pregnancy and is safe for you and your baby. V Vaccinations are an important concern for pregnant women. Get needed vaccines before pregnancy. Center for Disease Control (http://www.wolf.info/) has clear guidelines for the use of vaccines during pregnancy. Review the list, be sure to discuss it with your doctor. Prenatal vitamins are helpful  and healthy for you and the baby. Do not take extra vitamins except what is recommended. Taking too much of certain vitamins can cause overdose problems. Continuous vomiting should be reported to your caregiver. Varicose veins may appear especially if there is a family history of varicose veins. They should subside after the delivery of the baby. Support hose helps if there is leg discomfort. W Being overweight or underweight during pregnancy may cause problems. Try to get within 15 pounds of your ideal weight before pregnancy. Remember, pregnancy is not a time to be dieting! Do not stop eating or start skipping meals as your weight increases. Both you and your baby need the calories and nutrition you receive from a healthy diet. Be sure to consult with your doctor about your diet. There is a formula and diet plan available depending on whether you are overweight or underweight. Your caregiver or nutritionist can help and advise you if necessary. X Avoid X-rays. If you must have dental work or diagnostic tests, tell your dentist or physician that you are pregnant so that extra care can be taken. X-rays should only be taken when  the risks of not taking them outweigh the risk of taking them. If needed, only the minimum amount of radiation should be used. When X-rays are necessary, protective lead shields should be used to cover areas of the body that are not being X-rayed. Y Your baby loves you. Breastfeeding your baby creates a loving and very close bond between the two of you. Give your baby a healthy environment to live in while you are pregnant. Infants and children require constant care and guidance. Their health and safety should be carefully watched at all times. After the baby is born, rest or take a nap when the baby is sleeping. Z Get your ZZZs. Be sure to get plenty of rest. Resting on your side as often as possible, especially on your left side is advised. It provides the best circulation to your baby and helps reduce swelling. Try taking a nap for 30 to 45 minutes in the afternoon when possible. After the baby is born rest or take a nap when the baby is sleeping. Try elevating your feet for that amount of time when possible. It helps the circulation in your legs and helps reduce swelling.  Most information courtesy of the CDC. Document Released: 02/11/2005 Document Revised: 10/24/2010 Document Reviewed: 10/26/2008 [facility] Patient Information 2012 [place], [place].  Abdominal Pain Abdominal pain can be caused by many things. Your caregiver decides the seriousness of your pain by an examination and possibly blood tests and X-rays. Many cases can be observed and treated at home. Most abdominal pain is not caused by a disease and will probably improve without treatment. However, in many cases, more time must pass before a clear cause of the pain can be found. Before that point, it may not be known if you need more testing, or if hospitalization or surgery is needed. HOME CARE INSTRUCTIONS   Do not take laxatives unless directed by your caregiver.   Take pain medicine only as directed by your caregiver.   Only take  over-the-counter or prescription medicines for pain, discomfort, or fever as directed by your caregiver.   Try a clear liquid diet (broth, tea, or water) for as long as directed by your caregiver. Slowly move to a bland diet as tolerated.  SEEK IMMEDIATE MEDICAL CARE IF:   The pain does not go away.   You have a fever.  You keep throwing up (vomiting).   The pain is felt only in portions of the abdomen. Pain in the right side could possibly be appendicitis. In an adult, pain in the left lower portion of the abdomen could be colitis or diverticulitis.   You pass bloody or black tarry stools.  MAKE SURE YOU:   Understand these instructions.   Will watch your condition.   Will get help right away if you are not doing well or get worse.  Document Released: 11/21/2004 Document Revised: 10/24/2010 Document Reviewed: 09/30/2007 [facility] Patient Information 2012 [place], [place].  You must followup with her OB/GYN physician in 2-3 days for repeat blood draw. If there is any worsening of your pain you must followup in the emergency department or [facility] as there is concern for ectopic pregnancy.

## 2011-04-26 NOTE — ED Provider Notes (Signed)
History     CSN: [id number]  Arrival date & time 04/26/11  1417   First MD Initiated Contact with Patient 04/26/11 1538      Chief Complaint  Patient presents with  . Abdominal Pain    (Consider location/radiation/quality/duration/timing/severity/associated sxs/prior treatment) Patient is a 36 y.o. female presenting with abdominal pain. The history is provided by the patient. No language interpreter was used.  Abdominal Pain The primary symptoms of the illness include abdominal pain, nausea and vomiting. The primary symptoms of the illness do not include fever, fatigue, shortness of breath, diarrhea, dysuria, vaginal discharge or vaginal bleeding. The current episode started 6 to 12 hours ago. The onset of the illness was gradual. The problem has been gradually worsening.  The abdominal pain began 6 to 12 hours ago. The pain came on gradually. The abdominal pain has been gradually worsening since its onset. The abdominal pain is located in the periumbilical region, RLQ and right flank. The abdominal pain does not radiate. The abdominal pain is relieved by nothing. The abdominal pain is exacerbated by certain positions and vomiting.  Nausea began today. The nausea is exacerbated by food.  The vomiting began today. Vomiting occurred once. The emesis contains stomach contents.  Additional symptoms associated with the illness include back pain. Symptoms associated with the illness do not include chills, anorexia, constipation, urgency or frequency.    History reviewed. No pertinent past medical history.  History reviewed. No pertinent past surgical history.  History reviewed. No pertinent family history.  History  Substance Use Topics  . Smoking status: Not on file  . Smokeless tobacco: Not on file  . Alcohol Use: No    OB History    Grav Para Term Preterm Abortions TAB SAB Ect Mult Living   1               Review of Systems  Constitutional: Negative for fever, chills, activity  change, appetite change and fatigue.  HENT: Negative for congestion, sore throat, rhinorrhea, neck pain and neck stiffness.   Respiratory: Negative for cough and shortness of breath.   Cardiovascular: Negative for chest pain and palpitations.  Gastrointestinal: Positive for nausea, vomiting and abdominal pain. Negative for diarrhea, constipation and anorexia.  Genitourinary: Positive for flank pain. Negative for dysuria, urgency, frequency, vaginal bleeding and vaginal discharge.  Musculoskeletal: Positive for back pain. Negative for myalgias and arthralgias.  Neurological: Negative for dizziness, weakness, light-headedness, numbness and headaches.  All other systems reviewed and are negative.    Allergies  Review of patient's allergies indicates no known allergies.  Home Medications   Current Outpatient Rx  Name Route Sig Dispense Refill  . IBUPROFEN 200 MG PO TABS Oral Take 200 mg by mouth every 6 (six) hours as needed. For pain relief    . POLYSACCHARIDE IRON COMPLEX 150 MG PO CAPS Oral Take 150 mg by mouth 2 (two) times daily.    . ADULT MULTIVITAMIN W/MINERALS CH Oral Take 1 tablet by mouth daily.    Marland Kitchen ONDANSETRON HCL 4 MG PO TABS Oral Take 1 tablet (4 mg total) by mouth every 6 (six) hours. 12 tablet 0  . OXYCODONE-ACETAMINOPHEN 5-325 MG PO TABS Oral Take 1-2 tablets by mouth every 6 (six) hours as needed for pain. 15 tablet 0  . PRENATAL COMPLETE 14-0.4 MG PO TABS Oral Take 1 tablet by mouth daily. 60 each 0    BP 113/71  Pulse 98  Temp 98.4 F (36.9 C)  Resp 16  SpO2  98%  Physical Exam  Nursing note and vitals reviewed. Constitutional: She is oriented to person, place, and time. She appears well-developed and well-nourished.       Appears uncomfortable.  HENT:  Head: Normocephalic and atraumatic.  Mouth/Throat: Oropharynx is clear and moist.  Eyes: Conjunctivae and EOM are normal. Pupils are equal, round, and reactive to light.  Neck: Normal range of motion. Neck  supple.  Cardiovascular: Normal rate, regular rhythm, normal heart sounds and intact distal pulses.  Exam reveals no gallop and no friction rub.   No murmur heard. Pulmonary/Chest: Effort normal and breath sounds normal. No respiratory distress. She exhibits no tenderness.  Abdominal: Soft. Bowel sounds are normal. There is tenderness. There is guarding (voluntary - unable to perform accurate exam due to patient grabbing my arm). There is no rebound.  Genitourinary: Vagina normal. Cervix exhibits discharge. Cervix exhibits no motion tenderness. Right adnexum displays no mass, no tenderness and no fullness. Left adnexum displays no mass, no tenderness and no fullness.  Musculoskeletal: Normal range of motion. She exhibits no tenderness.  Neurological: She is alert and oriented to person, place, and time. No cranial nerve deficit.  Skin: Skin is warm and dry. No rash noted.    ED Course  Procedures (including critical care time)  CRITICAL CARE Performed by: Dayton Bailiff   Total critical care time: 31 min  Critical care time was exclusive of separately billable procedures and treating other patients.  Critical care was necessary to treat or prevent imminent or life-threatening deterioration.  Critical care was time spent personally by me on the following activities: development of treatment plan with patient and/or surrogate as well as nursing, discussions with consultants, evaluation of patient's response to treatment, examination of patient, obtaining history from patient or surrogate, ordering and performing treatments and interventions, ordering and review of laboratory studies, ordering and review of radiographic studies, pulse oximetry and re-evaluation of patient's condition.  Labs Reviewed  URINALYSIS, ROUTINE W REFLEX MICROSCOPIC - Abnormal; Notable for the following:    Ketones, ur TRACE (*)    All other components within normal limits  CBC - Abnormal; Notable for the following:     Hemoglobin 11.5 (*)    HCT 35.3 (*)    RDW 16.2 (*)    All other components within normal limits  COMPREHENSIVE METABOLIC PANEL - Abnormal; Notable for the following:    Potassium 3.4 (*)    Glucose, Bld 100 (*)    All other components within normal limits  PREGNANCY, URINE - Abnormal; Notable for the following:    Preg Test, Ur POSITIVE (*)    All other components within normal limits  HCG, QUANTITATIVE, PREGNANCY - Abnormal; Notable for the following:    hCG, Beta Chain, Quant, S 194 (*)    All other components within normal limits  WET PREP, GENITAL - Abnormal; Notable for the following:    WBC, Wet Prep HPF POC FEW (*)    All other components within normal limits  DIFFERENTIAL  LIPASE, BLOOD  POCT PREGNANCY, URINE  GC/CHLAMYDIA PROBE AMP, GENITAL   US Ob Comp Less 14 Wks  04/26/2011  *RADIOLOGY REPORT*  Clinical Data:  Positive pregnancy test.  Abdominal pain. Quantitative beta HCG is 194.  By LMP, gestational age is 4 weeks 5 days with LMP EDC of 12/29/2011.  OBSTETRIC <14 WK [place] AND TRANSVAGINAL OB US  Technique:  Both transabdominal and transvaginal ultrasound examinations were performed for complete evaluation of the gestation as well as the  maternal uterus, adnexal regions, and pelvic cul-de-sac.  Transvaginal technique was performed to assess early pregnancy.  Comparison:  None  Intrauterine gestational sac:  None Yolk sac: None Embryo: None Cardiac Activity: None  Maternal uterus/adnexae: Right ovary contains a hypoechoic area which measures 1.8 x 2.3 x 2.5 cm.  The left ovary has a normal appearance.  Small fibroid is identified in the left fundal region, subserosal in location and measuring 3.5 x 2.1 x 2.6 cm.  IMPRESSION:  1.  No evidence for intrauterine or ectopic pregnancy. 2. Serial quantitative beta HCG values and follow-up ultrasound are recommended as appropriate to document progression of and location of pregnancy. Ectopic pregnancy has not been excluded.  Original  Report Authenticated By: Patterson Hammersmith, M.D.   US Ob Transvaginal  04/26/2011  *RADIOLOGY REPORT*  Clinical Data:  Positive pregnancy test.  Abdominal pain. Quantitative beta HCG is 194.  By LMP, gestational age is 4 weeks 5 days with LMP EDC of 12/29/2011.  OBSTETRIC <14 WK [place] AND TRANSVAGINAL OB US  Technique:  Both transabdominal and transvaginal ultrasound examinations were performed for complete evaluation of the gestation as well as the maternal uterus, adnexal regions, and pelvic cul-de-sac.  Transvaginal technique was performed to assess early pregnancy.  Comparison:  None  Intrauterine gestational sac:  None Yolk sac: None Embryo: None Cardiac Activity: None  Maternal uterus/adnexae: Right ovary contains a hypoechoic area which measures 1.8 x 2.3 x 2.5 cm.  The left ovary has a normal appearance.  Small fibroid is identified in the left fundal region, subserosal in location and measuring 3.5 x 2.1 x 2.6 cm.  IMPRESSION:  1.  No evidence for intrauterine or ectopic pregnancy. 2. Serial quantitative beta HCG values and follow-up ultrasound are recommended as appropriate to document progression of and location of pregnancy. Ectopic pregnancy has not been excluded.  Original Report Authenticated By: Patterson Hammersmith, M.D.     1. Abdominal pain   2. Pregnancy as incidental finding       MDM  Notified by radiology that the MR is unremarkable. There is no hydronephrosis nor is there evidence of appendicitis. Her pain is improved. The patient's Sharene Butters is to low identified signs of pregnancy on ultrasound. I spent a significant amount of time explaining the importance of followup in 2-3 days for repeat Quant. I also explained for her to return for any worsening pain at this may be concerning for ectopic pregnancy. She was instructed to followup with her OB/GYN in approximately 2 days. He is provided and pain medication, antinausea, prenatal vitamins by prescription.        Dayton Bailiff,  MD 04/26/11 2227

## 2011-04-26 NOTE — ED Notes (Signed)
Patient transported to MRI 

## 2011-04-27 LAB — GC/CHLAMYDIA PROBE AMP, GENITAL: Chlamydia, DNA Probe: NEGATIVE

## 2011-06-25 LAB — OB RESULTS CONSOLE HEPATITIS B SURFACE ANTIGEN: Hepatitis B Surface Ag: NEGATIVE

## 2011-06-25 LAB — OB RESULTS CONSOLE RPR: RPR: NONREACTIVE

## 2011-12-29 ENCOUNTER — Encounter (HOSPITAL_COMMUNITY): Payer: Self-pay | Admitting: *Deleted

## 2011-12-29 ENCOUNTER — Inpatient Hospital Stay (HOSPITAL_COMMUNITY)
Admission: AD | Admit: 2011-12-29 | Discharge: 2011-12-30 | Disposition: A | Discharge status: Home / Self Care | Payer: 59 | Source: Ambulatory Visit | Attending: Obstetrics and Gynecology | Admitting: Obstetrics and Gynecology

## 2011-12-29 DIAGNOSIS — R197 Diarrhea, unspecified: Secondary | ICD-10-CM | POA: Insufficient documentation

## 2011-12-29 DIAGNOSIS — R109 Unspecified abdominal pain: Secondary | ICD-10-CM | POA: Insufficient documentation

## 2011-12-29 DIAGNOSIS — O479 False labor, unspecified: Secondary | ICD-10-CM | POA: Insufficient documentation

## 2011-12-29 HISTORY — DX: Benign neoplasm of connective and other soft tissue, unspecified: D21.9

## 2011-12-29 HISTORY — DX: Gestational (pregnancy-induced) hypertension without significant proteinuria, unspecified trimester: O13.9

## 2011-12-29 HISTORY — DX: Unspecified convulsions: R56.9

## 2011-12-29 NOTE — MAU Provider Note (Signed)
History     CSN: [id number]  Arrival date and time: 12/29/11 2035   First Provider Initiated Contact with Patient 12/29/11 2144      Chief Complaint  Patient presents with  . Abdominal Pain   HPI Jo [name] is a 36 y.o. female @ [redacted]w[redacted]d gestation who presents to MAU with abdominal pain. The pain started this morning. First began as constipation and then started having loose stools x 7. She describes the pain as constant cramp feeling. The pain is located in the lower abdomen. She rates the pain as 5/10. The pain is worse with movement. She also complains of contraction that are coming about every 20 minutes or less. The contractions have been going on all day. The history was provided by the patient.  OB History    Grav Para Term Preterm Abortions TAB SAB Ect Mult Living   4 3 2 1      3       Past Medical History  Diagnosis Date  . Pregnancy induced hypertension   . Seizures   . Fibroid     No past surgical history on file.  Family History  Problem Relation Age of Onset  . Diabetes Father   . Heart disease Father   . Hypertension Father   . Diabetes Maternal Aunt   . Hypertension Maternal Aunt   . Hypertension Paternal Uncle   . Cancer Maternal Grandmother     Colon  . Diabetes Maternal Grandmother   . Hypertension Paternal Grandmother   . Heart disease Paternal Grandmother     History  Substance Use Topics  . Smoking status: Not on file  . Smokeless tobacco: Not on file  . Alcohol Use: No    Allergies: No Known Allergies  Prescriptions prior to admission  Medication Sig Dispense Refill  . phenylephrine-shark liver oil-mineral oil-petrolatum (PREPARATION H) 0.25-3-14-71.9 % rectal ointment Place rectally 2 (two) times daily as needed.        Review of Systems  Constitutional: Negative for fever, chills and weight loss.  HENT: Negative for ear pain, nosebleeds, congestion, sore throat and neck pain.   Eyes: Negative for blurred vision, double  vision, photophobia and pain.  Respiratory: Negative for cough, shortness of breath and wheezing.   Cardiovascular: Negative for chest pain, palpitations and leg swelling.  Gastrointestinal: Positive for nausea, abdominal pain and constipation. Negative for heartburn, vomiting and diarrhea.  Genitourinary: Positive for frequency. Negative for dysuria and urgency.  Musculoskeletal: Positive for back pain. Negative for myalgias.  Skin: Negative for itching and rash.  Neurological: Positive for seizures (history of seizures, no medication, last seizure 2011.) and headaches. Negative for dizziness, sensory change, speech change and weakness.  Endo/Heme/Allergies: Does not bruise/bleed easily.  Psychiatric/Behavioral: Negative for depression. The patient is not nervous/anxious and does not have insomnia.    Physical Exam   Blood pressure 131/85, pulse 86, temperature 98.1 F (36.7 C), temperature source Oral, resp. rate 18, height 5\' 1"  (1.549 m), weight 152 lb (68.947 kg), SpO2 100.00%, unknown if currently breastfeeding.  Physical Exam  Nursing note and vitals reviewed. Constitutional: She is oriented to person, place, and time. She appears well-developed and well-nourished. No distress.  HENT:  Head: Normocephalic and atraumatic.  Eyes: EOM are normal.  Neck: Neck supple.  Cardiovascular: Normal rate.   Respiratory: Effort normal.  GI: Soft.       Tender with palpation both sides of abdomen. Pain with movement.  Genitourinary:  Dilation: 1 Effacement (%): 40 Cervical Position: Middle Station: -3 Exam by:: L. Munford RN  Musculoskeletal: Normal range of motion.  Neurological: She is alert and oriented to person, place, and time.  Skin: Skin is warm and dry.  Psychiatric: She has a normal mood and affect. Her behavior is normal. Judgment and thought content normal.   EFM: baseline 130, moderate variability, with variable decelerations, contractions every 4 to 5  minutes.  Assessment: 36 y.o. female @ [redacted]w[redacted]d with abdominal pain   Uterine contractions   Diarrhea  Plan:   MAU Course: Medical screening exam complete. Dr. Tenny Craw notified and will observe for another hour and then recheck cervix. RN will notify Dr. Tenny Craw of patient status in one hour.  Procedures ,, RN, FNP, [facility] 12/29/2011, 9:48 PM

## 2011-12-29 NOTE — Progress Notes (Signed)
Dr. Tenny Craw notified patient c/o of constant abdominal pain and diarrhea since this 6 this morning. Patient states she was having contractions about every 20 mins. Patients last delivery was by C-Section for placenta previa however plans a TOLAC with this pregnancy. Contractions every 4-5 on the monitor SVE 1/50/-3 which is not a change from previous checks in the office. Fetal strip reassuring however has had some variable decelerations on the monitor. Orders to continue to monitor and recheck in 1 hour. If strip does not become non-reactive to obtain a BPP.

## 2011-12-29 NOTE — Progress Notes (Signed)
Dr. Tenny Craw notified cervix unchanged. Contractions have spaced out on the monitor. Reactive tracing. Orders received to discharge home.

## 2011-12-29 NOTE — MAU Note (Signed)
Constant abdominal pain since this morning around 6am. Diarrhea 6 times until noon today. Contractions every .

## 2012-01-04 ENCOUNTER — Inpatient Hospital Stay (HOSPITAL_COMMUNITY): Payer: 59 | Admitting: Anesthesiology

## 2012-01-04 ENCOUNTER — Encounter (HOSPITAL_COMMUNITY): Payer: Self-pay

## 2012-01-04 ENCOUNTER — Encounter (HOSPITAL_COMMUNITY): Payer: Self-pay | Admitting: Anesthesiology

## 2012-01-04 ENCOUNTER — Inpatient Hospital Stay (HOSPITAL_COMMUNITY)
Admission: RE | Admit: 2012-01-04 | Discharge: 2012-01-06 | DRG: 766 | Disposition: A | Discharge status: Home / Self Care | Payer: 59 | Source: Ambulatory Visit | Attending: Obstetrics & Gynecology | Admitting: Obstetrics & Gynecology

## 2012-01-04 ENCOUNTER — Encounter (HOSPITAL_COMMUNITY)
Admission: RE | Disposition: A | Discharge status: Home / Self Care | Payer: Self-pay | Source: Ambulatory Visit | Attending: Obstetrics & Gynecology

## 2012-01-04 VITALS — BP 119/78 | HR 98 | Temp 98.0°F | Resp 18 | Ht 61.0 in | Wt 152.0 lb

## 2012-01-04 DIAGNOSIS — O322XX0 Maternal care for transverse and oblique lie, not applicable or unspecified: Secondary | ICD-10-CM | POA: Diagnosis present

## 2012-01-04 DIAGNOSIS — D4959 Neoplasm of unspecified behavior of other genitourinary organ: Secondary | ICD-10-CM | POA: Diagnosis present

## 2012-01-04 DIAGNOSIS — D259 Leiomyoma of uterus, unspecified: Secondary | ICD-10-CM | POA: Diagnosis present

## 2012-01-04 DIAGNOSIS — O09529 Supervision of elderly multigravida, unspecified trimester: Secondary | ICD-10-CM | POA: Diagnosis present

## 2012-01-04 DIAGNOSIS — Z3480 Encounter for supervision of other normal pregnancy, unspecified trimester: Secondary | ICD-10-CM

## 2012-01-04 DIAGNOSIS — O34219 Maternal care for unspecified type scar from previous cesarean delivery: Secondary | ICD-10-CM | POA: Diagnosis present

## 2012-01-04 DIAGNOSIS — O480 Post-term pregnancy: Secondary | ICD-10-CM | POA: Diagnosis present

## 2012-01-04 DIAGNOSIS — O34599 Maternal care for other abnormalities of gravid uterus, unspecified trimester: Secondary | ICD-10-CM | POA: Diagnosis present

## 2012-01-04 LAB — CBC
HCT: 38.2 % (ref 36.0–46.0)
MCH: 30.9 pg (ref 26.0–34.0)
MCHC: 34.3 g/dL (ref 30.0–36.0)
MCV: 90.1 fL (ref 78.0–100.0)
RDW: 13.4 % (ref 11.5–15.5)

## 2012-01-04 LAB — TYPE AND SCREEN
ABO/RH(D): O POS
Unit division: 0
Unit division: 0

## 2012-01-04 SURGERY — Surgical Case
Anesthesia: Regional | Site: Abdomen | Wound class: Clean Contaminated

## 2012-01-04 MED ORDER — DIPHENHYDRAMINE HCL 25 MG PO CAPS: 25.0000 mg | ORAL_CAPSULE | Freq: Four times a day (QID) | ORAL | Status: DC | PRN

## 2012-01-04 MED ORDER — ATROPINE SULFATE 0.4 MG/ML IJ SOLN
INTRAMUSCULAR | Status: DC | PRN
  Administered 2012-01-04: 0.4 mg via INTRAVENOUS

## 2012-01-04 MED ORDER — LACTATED RINGERS IV SOLN: INTRAVENOUS | Status: DC

## 2012-01-04 MED ORDER — WITCH HAZEL-GLYCERIN EX PADS: 1.0000 "application " | MEDICATED_PAD | CUTANEOUS | Status: DC | PRN

## 2012-01-04 MED ORDER — FENTANYL CITRATE 0.05 MG/ML IJ SOLN
INTRAMUSCULAR | Status: AC
  Filled 2012-01-04: qty 2

## 2012-01-04 MED ORDER — SENNOSIDES-DOCUSATE SODIUM 8.6-50 MG PO TABS
2.0000 | ORAL_TABLET | Freq: Every day | ORAL | Status: DC
  Administered 2012-01-04 – 2012-01-05 (×2): 2 via ORAL

## 2012-01-04 MED ORDER — PHENYLEPHRINE 40 MCG/ML (10ML) SYRINGE FOR IV PUSH (FOR BLOOD PRESSURE SUPPORT)
PREFILLED_SYRINGE | INTRAVENOUS | Status: AC
  Filled 2012-01-04: qty 5

## 2012-01-04 MED ORDER — TETANUS-DIPHTH-ACELL PERTUSSIS 5-2.5-18.5 LF-MCG/0.5 IM SUSP
0.5000 mL | Freq: Once | INTRAMUSCULAR | Status: AC
  Administered 2012-01-05: 0.5 mL via INTRAMUSCULAR
  Filled 2012-01-04: qty 0.5

## 2012-01-04 MED ORDER — FENTANYL CITRATE 0.05 MG/ML IJ SOLN
INTRAMUSCULAR | Status: DC | PRN
  Administered 2012-01-04 (×4): 50 ug via INTRAVENOUS

## 2012-01-04 MED ORDER — SIMETHICONE 80 MG PO CHEW
80.0000 mg | CHEWABLE_TABLET | Freq: Three times a day (TID) | ORAL | Status: DC
  Administered 2012-01-04 – 2012-01-06 (×6): 80 mg via ORAL

## 2012-01-04 MED ORDER — LACTATED RINGERS IV SOLN
INTRAVENOUS | Status: DC
  Administered 2012-01-04 (×2): 125 mL/h via INTRAVENOUS

## 2012-01-04 MED ORDER — OXYTOCIN 40 UNITS IN LACTATED RINGERS INFUSION - SIMPLE MED: 62.5000 mL/h | INTRAVENOUS | Status: DC

## 2012-01-04 MED ORDER — DIPHENHYDRAMINE HCL 50 MG/ML IJ SOLN: 25.0000 mg | INTRAMUSCULAR | Status: DC | PRN

## 2012-01-04 MED ORDER — ZOLPIDEM TARTRATE 5 MG PO TABS: 5.0000 mg | ORAL_TABLET | Freq: Every evening | ORAL | Status: DC | PRN

## 2012-01-04 MED ORDER — DIPHENHYDRAMINE HCL 50 MG/ML IJ SOLN: 12.5000 mg | INTRAMUSCULAR | Status: DC | PRN

## 2012-01-04 MED ORDER — ONDANSETRON HCL 4 MG/2ML IJ SOLN: 4.0000 mg | INTRAMUSCULAR | Status: DC | PRN

## 2012-01-04 MED ORDER — EPHEDRINE SULFATE 50 MG/ML IJ SOLN
INTRAMUSCULAR | Status: DC | PRN
  Administered 2012-01-04 (×2): 25 mg via INTRAVENOUS
  Administered 2012-01-04: 10 mg via INTRAVENOUS
  Administered 2012-01-04: 25 mg via INTRAVENOUS

## 2012-01-04 MED ORDER — INFLUENZA VIRUS VACC SPLIT PF IM SUSP
0.5000 mL | INTRAMUSCULAR | Status: AC
  Administered 2012-01-05: 0.5 mL via INTRAMUSCULAR
  Filled 2012-01-04: qty 0.5

## 2012-01-04 MED ORDER — EPHEDRINE 5 MG/ML INJ
INTRAVENOUS | Status: AC
  Filled 2012-01-04: qty 20

## 2012-01-04 MED ORDER — KETOROLAC TROMETHAMINE 30 MG/ML IJ SOLN: 30.0000 mg | Freq: Four times a day (QID) | INTRAMUSCULAR | Status: DC | PRN

## 2012-01-04 MED ORDER — OXYCODONE-ACETAMINOPHEN 5-325 MG PO TABS
1.0000 | ORAL_TABLET | ORAL | Status: DC | PRN
  Administered 2012-01-05: 0.5 via ORAL
  Administered 2012-01-05 – 2012-01-06 (×4): 1 via ORAL
  Filled 2012-01-04 (×5): qty 1

## 2012-01-04 MED ORDER — ACETAMINOPHEN 325 MG PO TABS: 650.0000 mg | ORAL_TABLET | ORAL | Status: DC | PRN

## 2012-01-04 MED ORDER — OXYTOCIN 40 UNITS IN LACTATED RINGERS INFUSION - SIMPLE MED: 1.0000 m[IU]/min | INTRAVENOUS | Status: DC

## 2012-01-04 MED ORDER — TERBUTALINE SULFATE 1 MG/ML IJ SOLN: 0.2500 mg | Freq: Once | INTRAMUSCULAR | Status: DC | PRN

## 2012-01-04 MED ORDER — PROMETHAZINE HCL 25 MG/ML IJ SOLN: 6.2500 mg | INTRAMUSCULAR | Status: DC | PRN

## 2012-01-04 MED ORDER — DIBUCAINE 1 % RE OINT: 1.0000 "application " | TOPICAL_OINTMENT | RECTAL | Status: DC | PRN

## 2012-01-04 MED ORDER — ATROPINE SULFATE 0.4 MG/ML IJ SOLN
INTRAMUSCULAR | Status: AC
Start: 2012-01-04 — End: 2012-01-04
  Filled 2012-01-04: qty 1

## 2012-01-04 MED ORDER — IBUPROFEN 600 MG PO TABS
600.0000 mg | ORAL_TABLET | Freq: Four times a day (QID) | ORAL | Status: DC
  Administered 2012-01-04 – 2012-01-06 (×7): 600 mg via ORAL
  Filled 2012-01-04 (×7): qty 1

## 2012-01-04 MED ORDER — LIDOCAINE HCL (PF) 1 % IJ SOLN: 30.0000 mL | INTRAMUSCULAR | Status: DC | PRN

## 2012-01-04 MED ORDER — ACETAMINOPHEN 10 MG/ML IV SOLN
1000.0000 mg | Freq: Four times a day (QID) | INTRAVENOUS | Status: AC | PRN
  Filled 2012-01-04: qty 100

## 2012-01-04 MED ORDER — METOCLOPRAMIDE HCL 5 MG/ML IJ SOLN: 10.0000 mg | Freq: Three times a day (TID) | INTRAMUSCULAR | Status: DC | PRN

## 2012-01-04 MED ORDER — BUTORPHANOL TARTRATE 1 MG/ML IJ SOLN: 1.0000 mg | INTRAMUSCULAR | Status: DC | PRN

## 2012-01-04 MED ORDER — OXYTOCIN 10 UNIT/ML IJ SOLN
INTRAMUSCULAR | Status: AC
Start: 2012-01-04 — End: 2012-01-04
  Filled 2012-01-04: qty 1

## 2012-01-04 MED ORDER — MORPHINE SULFATE 0.5 MG/ML IJ SOLN
INTRAMUSCULAR | Status: AC
  Filled 2012-01-04: qty 10

## 2012-01-04 MED ORDER — FENTANYL CITRATE 0.05 MG/ML IJ SOLN: 25.0000 ug | INTRAMUSCULAR | Status: DC | PRN

## 2012-01-04 MED ORDER — ONDANSETRON HCL 4 MG/2ML IJ SOLN: 4.0000 mg | Freq: Three times a day (TID) | INTRAMUSCULAR | Status: DC | PRN

## 2012-01-04 MED ORDER — DIPHENHYDRAMINE HCL 25 MG PO CAPS: 25.0000 mg | ORAL_CAPSULE | ORAL | Status: DC | PRN

## 2012-01-04 MED ORDER — CITRIC ACID-SODIUM CITRATE 334-500 MG/5ML PO SOLN
30.0000 mL | ORAL | Status: DC | PRN
  Filled 2012-01-04: qty 15

## 2012-01-04 MED ORDER — PHENYLEPHRINE HCL 10 MG/ML IJ SOLN
INTRAMUSCULAR | Status: DC | PRN
  Administered 2012-01-04 (×2): 40 ug via INTRAVENOUS

## 2012-01-04 MED ORDER — LIDOCAINE-EPINEPHRINE (PF) 2 %-1:200000 IJ SOLN
INTRAMUSCULAR | Status: AC
  Filled 2012-01-04: qty 20

## 2012-01-04 MED ORDER — TERBUTALINE SULFATE 1 MG/ML IJ SOLN
INTRAMUSCULAR | Status: AC
  Filled 2012-01-04: qty 1

## 2012-01-04 MED ORDER — EPHEDRINE 5 MG/ML INJ
10.0000 mg | INTRAVENOUS | Status: DC | PRN
  Filled 2012-01-04: qty 4

## 2012-01-04 MED ORDER — LACTATED RINGERS IV SOLN: 500.0000 mL | Freq: Once | INTRAVENOUS | Status: DC

## 2012-01-04 MED ORDER — KETOROLAC TROMETHAMINE 30 MG/ML IJ SOLN
30.0000 mg | Freq: Four times a day (QID) | INTRAMUSCULAR | Status: DC | PRN
  Administered 2012-01-04: 30 mg via INTRAVENOUS

## 2012-01-04 MED ORDER — LACTATED RINGERS IV SOLN
INTRAVENOUS | Status: DC | PRN
  Administered 2012-01-04 (×2): via INTRAVENOUS

## 2012-01-04 MED ORDER — OXYTOCIN 40 UNITS IN LACTATED RINGERS INFUSION - SIMPLE MED: 62.5000 mL/h | INTRAVENOUS | Status: AC

## 2012-01-04 MED ORDER — MEPERIDINE HCL 25 MG/ML IJ SOLN: 6.2500 mg | INTRAMUSCULAR | Status: DC | PRN

## 2012-01-04 MED ORDER — LIDOCAINE HCL 1 % IJ SOLN: INTRAMUSCULAR | Status: DC | PRN

## 2012-01-04 MED ORDER — MIDAZOLAM HCL 2 MG/2ML IJ SOLN: 0.5000 mg | Freq: Once | INTRAMUSCULAR | Status: DC | PRN

## 2012-01-04 MED ORDER — MENTHOL 3 MG MT LOZG: 1.0000 | LOZENGE | OROMUCOSAL | Status: DC | PRN

## 2012-01-04 MED ORDER — OXYTOCIN BOLUS FROM INFUSION: 500.0000 mL | INTRAVENOUS | Status: DC

## 2012-01-04 MED ORDER — SCOPOLAMINE 1 MG/3DAYS TD PT72
MEDICATED_PATCH | TRANSDERMAL | Status: AC
  Administered 2012-01-04: 1.5 mg via TRANSDERMAL
  Filled 2012-01-04: qty 1

## 2012-01-04 MED ORDER — CEFAZOLIN SODIUM-DEXTROSE 2-3 GM-% IV SOLR
INTRAVENOUS | Status: DC | PRN
  Administered 2012-01-04: 2 g via INTRAVENOUS

## 2012-01-04 MED ORDER — SCOPOLAMINE 1 MG/3DAYS TD PT72
1.0000 | MEDICATED_PATCH | Freq: Once | TRANSDERMAL | Status: DC
  Administered 2012-01-04: 1.5 mg via TRANSDERMAL

## 2012-01-04 MED ORDER — CEFAZOLIN SODIUM-DEXTROSE 2-3 GM-% IV SOLR
INTRAVENOUS | Status: AC
  Filled 2012-01-04: qty 50

## 2012-01-04 MED ORDER — SODIUM CHLORIDE 0.9 % IV SOLN: 1.0000 ug/kg/h | INTRAVENOUS | Status: DC | PRN

## 2012-01-04 MED ORDER — ONDANSETRON HCL 4 MG/2ML IJ SOLN
INTRAMUSCULAR | Status: DC | PRN
  Administered 2012-01-04: 4 mg via INTRAVENOUS

## 2012-01-04 MED ORDER — LANOLIN HYDROUS EX OINT: 1.0000 "application " | TOPICAL_OINTMENT | CUTANEOUS | Status: DC | PRN

## 2012-01-04 MED ORDER — CITRIC ACID-SODIUM CITRATE 334-500 MG/5ML PO SOLN
ORAL | Status: AC
  Administered 2012-01-04: 30 mL
  Filled 2012-01-04: qty 15

## 2012-01-04 MED ORDER — OXYTOCIN 10 UNIT/ML IJ SOLN
40.0000 [IU] | INTRAMUSCULAR | Status: DC | PRN
  Administered 2012-01-04: 40 [IU] via INTRAVENOUS

## 2012-01-04 MED ORDER — PRENATAL MULTIVITAMIN CH
1.0000 | ORAL_TABLET | Freq: Every day | ORAL | Status: DC
  Administered 2012-01-05 – 2012-01-06 (×2): 1 via ORAL
  Filled 2012-01-04 (×2): qty 1

## 2012-01-04 MED ORDER — PHENYLEPHRINE 40 MCG/ML (10ML) SYRINGE FOR IV PUSH (FOR BLOOD PRESSURE SUPPORT): 80.0000 ug | PREFILLED_SYRINGE | INTRAVENOUS | Status: DC | PRN

## 2012-01-04 MED ORDER — NALBUPHINE HCL 10 MG/ML IJ SOLN
5.0000 mg | INTRAMUSCULAR | Status: DC | PRN
  Filled 2012-01-04: qty 1

## 2012-01-04 MED ORDER — LACTATED RINGERS IV SOLN
500.0000 mL | INTRAVENOUS | Status: DC | PRN
  Administered 2012-01-04: 1000 mL via INTRAVENOUS

## 2012-01-04 MED ORDER — PHENYLEPHRINE 40 MCG/ML (10ML) SYRINGE FOR IV PUSH (FOR BLOOD PRESSURE SUPPORT)
80.0000 ug | PREFILLED_SYRINGE | INTRAVENOUS | Status: DC | PRN
  Filled 2012-01-04: qty 5

## 2012-01-04 MED ORDER — OXYCODONE-ACETAMINOPHEN 5-325 MG PO TABS: 1.0000 | ORAL_TABLET | ORAL | Status: DC | PRN

## 2012-01-04 MED ORDER — ONDANSETRON HCL 4 MG/2ML IJ SOLN
INTRAMUSCULAR | Status: AC
  Filled 2012-01-04: qty 2

## 2012-01-04 MED ORDER — MORPHINE SULFATE (PF) 0.5 MG/ML IJ SOLN
INTRAMUSCULAR | Status: DC | PRN
  Administered 2012-01-04: 4 mg via EPIDURAL

## 2012-01-04 MED ORDER — SODIUM CHLORIDE 0.9 % IR SOLN
Status: DC | PRN
  Administered 2012-01-04: 1000 mL

## 2012-01-04 MED ORDER — EPHEDRINE 5 MG/ML INJ: 10.0000 mg | INTRAVENOUS | Status: DC | PRN

## 2012-01-04 MED ORDER — ONDANSETRON HCL 4 MG PO TABS: 4.0000 mg | ORAL_TABLET | ORAL | Status: DC | PRN

## 2012-01-04 MED ORDER — FENTANYL 2.5 MCG/ML BUPIVACAINE 1/10 % EPIDURAL INFUSION (WH - ANES)
14.0000 mL/h | INTRAMUSCULAR | Status: DC
  Administered 2012-01-04: 14 mL/h via EPIDURAL
  Filled 2012-01-04: qty 125

## 2012-01-04 MED ORDER — LIDOCAINE HCL (PF) 1 % IJ SOLN
INTRAMUSCULAR | Status: DC | PRN
  Administered 2012-01-04 (×4): 5 mL

## 2012-01-04 MED ORDER — ONDANSETRON HCL 4 MG/2ML IJ SOLN: 4.0000 mg | Freq: Four times a day (QID) | INTRAMUSCULAR | Status: DC | PRN

## 2012-01-04 MED ORDER — SIMETHICONE 80 MG PO CHEW: 80.0000 mg | CHEWABLE_TABLET | ORAL | Status: DC | PRN

## 2012-01-04 MED ORDER — SODIUM CHLORIDE 0.9 % IJ SOLN: 3.0000 mL | INTRAMUSCULAR | Status: DC | PRN

## 2012-01-04 MED ORDER — SODIUM BICARBONATE 8.4 % IV SOLN
INTRAVENOUS | Status: AC
  Filled 2012-01-04: qty 50

## 2012-01-04 MED ORDER — NALOXONE HCL 0.4 MG/ML IJ SOLN: 0.4000 mg | INTRAMUSCULAR | Status: DC | PRN

## 2012-01-04 MED ORDER — IBUPROFEN 600 MG PO TABS: 600.0000 mg | ORAL_TABLET | Freq: Four times a day (QID) | ORAL | Status: DC | PRN

## 2012-01-04 MED ORDER — KETOROLAC TROMETHAMINE 30 MG/ML IJ SOLN
INTRAMUSCULAR | Status: AC
  Administered 2012-01-04: 30 mg via INTRAVENOUS
  Filled 2012-01-04: qty 1

## 2012-01-04 MED ORDER — SODIUM BICARBONATE 8.4 % IV SOLN: INTRAVENOUS | Status: DC | PRN

## 2012-01-04 SURGICAL SUPPLY — 36 items
CLOTH BEACON ORANGE TIMEOUT ST (SAFETY) ×2 IMPLANT
CONTAINER PREFILL 10% NBF 15ML (MISCELLANEOUS) IMPLANT
DRAPE SURG 17X23 STRL (DRAPES) ×2 IMPLANT
DRSG COVADERM 4X10 (GAUZE/BANDAGES/DRESSINGS) ×1 IMPLANT
DURAPREP 26ML APPLICATOR (WOUND CARE) ×2 IMPLANT
ELECT REM PT RETURN 9FT ADLT (ELECTROSURGICAL) ×2
ELECTRODE REM PT RTRN 9FT ADLT (ELECTROSURGICAL) ×1 IMPLANT
EXTRACTOR VACUUM M CUP 4 TUBE (SUCTIONS) IMPLANT
GLOVE ECLIPSE 6.0 STRL STRAW (GLOVE) ×2 IMPLANT
GLOVE ECLIPSE 6.5 STRL STRAW (GLOVE) ×2 IMPLANT
GOWN PREVENTION PLUS LG XLONG (DISPOSABLE) ×5 IMPLANT
KIT ABG SYR 3ML LUER SLIP (SYRINGE) IMPLANT
NDL HYPO 25X5/8 SAFETYGLIDE (NEEDLE) ×1 IMPLANT
NEEDLE HYPO 25X5/8 SAFETYGLIDE (NEEDLE) IMPLANT
NS IRRIG 1000ML POUR BTL (IV SOLUTION) ×2 IMPLANT
PACK C SECTION WH (CUSTOM PROCEDURE TRAY) ×2 IMPLANT
PAD ABD 7.5X8 STRL (GAUZE/BANDAGES/DRESSINGS) ×1 IMPLANT
PAD OB MATERNITY 4.3X12.25 (PERSONAL CARE ITEMS) ×1 IMPLANT
RTRCTR C-SECT PINK 25CM LRG (MISCELLANEOUS) ×1 IMPLANT
SLEEVE SCD COMPRESS KNEE MED (MISCELLANEOUS) ×1 IMPLANT
STAPLER VISISTAT 35W (STAPLE) IMPLANT
SUT PLAIN 0 NONE (SUTURE) IMPLANT
SUT VIC AB 0 CT1 27 (SUTURE) ×6
SUT VIC AB 0 CT1 27XBRD ANBCTR (SUTURE) ×3 IMPLANT
SUT VIC AB 1 CTX 36 (SUTURE) ×4
SUT VIC AB 1 CTX36XBRD ANBCTRL (SUTURE) ×2 IMPLANT
SUT VIC AB 3-0 CT1 27 (SUTURE) ×2
SUT VIC AB 3-0 CT1 TAPERPNT 27 (SUTURE) ×1 IMPLANT
SUT VIC AB 3-0 PS2 18 (SUTURE)
SUT VIC AB 3-0 PS2 18XBRD (SUTURE) IMPLANT
SUT VIC AB 3-0 SH 27 (SUTURE)
SUT VIC AB 3-0 SH 27X BRD (SUTURE) IMPLANT
TAPE CLOTH SURG 4X10 WHT LF (GAUZE/BANDAGES/DRESSINGS) ×1 IMPLANT
TOWEL OR 17X24 6PK STRL BLUE (TOWEL DISPOSABLE) ×4 IMPLANT
TRAY FOLEY CATH 14FR (SET/KITS/TRAYS/PACK) ×1 IMPLANT
WATER STERILE IRR 1000ML POUR (IV SOLUTION) ×2 IMPLANT

## 2012-01-04 NOTE — Anesthesia Preprocedure Evaluation (Deleted)
Anesthesia Evaluation  Patient identified by MRN, date of birth, ID band Patient awake    Reviewed: Allergy & Precautions, H&P , Patient's Chart, lab work & pertinent test results  Airway Mallampati: II TM Distance: >3 FB Neck ROM: full    Dental No notable dental hx.    Pulmonary neg pulmonary ROS,  breath sounds clear to auscultation  Pulmonary exam normal       Cardiovascular hypertension, negative cardio ROS  Rhythm:regular Rate:Normal     Neuro/Psych Seizures -,  negative neurological ROS  negative psych ROS   GI/Hepatic negative GI ROS, Neg liver ROS,   Endo/Other  negative endocrine ROS  Renal/GU negative Renal ROS     Musculoskeletal   Abdominal   Peds  Hematology negative hematology ROS (+)   Anesthesia Other Findings Pregnancy induced hypertension     Seizures        Fibroid    Reproductive/Obstetrics (+) Pregnancy                           Anesthesia Physical Anesthesia Plan  ASA: III  Anesthesia Plan:    Post-op Pain Management:    Induction:   Airway Management Planned:   Additional Equipment:   Intra-op Plan:   Post-operative Plan:   Informed Consent: I have reviewed the patients History and Physical, chart, labs and discussed the procedure including the risks, benefits and alternatives for the proposed anesthesia with the patient or authorized representative who has indicated his/her understanding and acceptance.     Plan Discussed with:   Anesthesia Plan Comments:         Anesthesia Quick Evaluation

## 2012-01-04 NOTE — Op Note (Addendum)
Patient Name: Jo [name] MRN: [id number]  Date of Surgery: 01/04/2012    PREOPERATIVE DIAGNOSIS: Non-reasurring fetal heart tracing, Previous Cesarean delivery  POSTOPERATIVE DIAGNOSIS: Non-reasurring fetal heart tracing.  Tight double nuchal cord.   PROCEDURE: Repeat Low transverse cesarean section  SURGEON:  D. Arlyce Dice M.D.  ANESTHESIA: Epidural  ESTIMATED BLOOD LOSS: 800 ml  FINDINGS: Female, Apgar 9,9; Weight 6 lbs. 12 oz;clear fluid, tight double nuchal cord, intact lower segment, subserosal uterine myoma on right fundus, normal adnexa.   INDICATIONS: This is a 36 y.o.  Gravida 4 Para 2103 who was admitted for external cephalic version and induction.  Patient had had 2 vaginal deliveries and her last delivery was by cesarean due to placenta previa.  After successful version, AROM was performed and she spontaneously began to contract.  She developed repetitive severe variable decels during the latent phase and the decision was made to proceed with repeat cesarean delivery.  PROCEDURE IN DETAIL: The patient was taken to the operating room and the epidural that had been placed in labor was re injected for surgical anesthesia.  She was then placed in the supine position with left lateral displacement of the uterus. The abdomen was prepped and draped in a sterile fashion and the bladder was catheterized.  A low transverse abdominal incision was made and carried down to the fascia. The fascia was opened transversely and the rectus sheath was dissected from the underlying rectus muscle. The rectus midline was identified and opened by sharp and blunt dissection. The peritoneum was opened. An Alexis retractor was placed and the lower uterine segment was identified, entered transversely by careful sharp dissection, and extended bluntly.  The infant was delivered without difficulty. The placenta was delivered and the uterus was bluntly curettage. The lower segment was closed with running  interlocking Vicryl 1 suture.   The peritoneum and rectus muscle were closed in the midline with running 3-0 Vicryl suture. The fascia was closed with running 0 Vicryl suture and the skin was closed with staples. All sponge and instrument counts were correct.  The patient tolerated the procedure well and left the operating room in good condition.

## 2012-01-04 NOTE — Progress Notes (Signed)
Version completed.

## 2012-01-04 NOTE — Anesthesia Preprocedure Evaluation (Signed)
Anesthesia Evaluation  Patient identified by MRN, date of birth, ID band Patient awake    Reviewed: Allergy & Precautions, H&P , Patient's Chart, lab work & pertinent test results  Airway Mallampati: II TM Distance: >3 FB Neck ROM: full    Dental No notable dental hx.    Pulmonary neg pulmonary ROS,  breath sounds clear to auscultation  Pulmonary exam normal       Cardiovascular negative cardio ROS  Rhythm:regular Rate:Normal     Neuro/Psych Seizures -,  negative neurological ROS  negative psych ROS   GI/Hepatic negative GI ROS, Neg liver ROS,   Endo/Other  negative endocrine ROS  Renal/GU negative Renal ROS     Musculoskeletal   Abdominal   Peds  Hematology negative hematology ROS (+)   Anesthesia Other Findings   Reproductive/Obstetrics (+) Pregnancy                           Anesthesia Physical Anesthesia Plan  ASA: II  Anesthesia Plan: Epidural   Post-op Pain Management:    Induction:   Airway Management Planned:   Additional Equipment:   Intra-op Plan:   Post-operative Plan:   Informed Consent: I have reviewed the patients History and Physical, chart, labs and discussed the procedure including the risks, benefits and alternatives for the proposed anesthesia with the patient or authorized representative who has indicated his/her understanding and acceptance.     Plan Discussed with:   Anesthesia Plan Comments:         Anesthesia Quick Evaluation

## 2012-01-04 NOTE — Anesthesia Postprocedure Evaluation (Signed)
Anesthesia Post Note  Patient: Jo [name]  Procedure(s) Performed: Procedure(s) (LRB): CESAREAN SECTION (N/A)  Anesthesia type: Epidural  Patient location: PACU  Post pain: Pain level controlled  Post assessment: Post-op Vital signs reviewed  Last Vitals:  Filed Vitals:   01/04/12 1445  BP:   Pulse: 72  Temp:   Resp: 20    Post vital signs: Reviewed  Level of consciousness: awake  Complications: No apparent anesthesia complications

## 2012-01-04 NOTE — Transfer of Care (Signed)
Immediate Anesthesia Transfer of Care Note  Patient: Jo [name]  Procedure(s) Performed: Procedure(s) (LRB) with comments: CESAREAN SECTION (N/A) - Repeat cesarean section with delivery of baby boy at 1344. Apgars 9/9.  Patient Location: PACU  Anesthesia Type:Epidural  Level of Consciousness: awake, alert  and oriented  Airway & Oxygen Therapy: Patient Spontanous Breathing  Post-op Assessment: Report given to PACU RN and Post -op Vital signs reviewed and stable  Post vital signs: Reviewed and stable  Complications: No apparent anesthesia complications

## 2012-01-04 NOTE — Anesthesia Postprocedure Evaluation (Signed)
  Anesthesia Post-op Note  Patient: Jo [name]  Procedure(s) Performed: Procedure(s) (LRB) with comments: CESAREAN SECTION (N/A) - Repeat cesarean section with delivery of baby boy at 1344. Apgars 9/9.  Patient Location: PACU and Mother/Baby  Anesthesia Type:Epidural  Level of Consciousness: awake, alert  and oriented  Airway and Oxygen Therapy: Patient Spontanous Breathing    Post-op Assessment: Patient's Cardiovascular Status Stable and Respiratory Function Stable  Post-op Vital Signs: stable  Complications: No apparent anesthesia complications

## 2012-01-04 NOTE — H&P (Signed)
36 y.o. [id number]  Estimated Date of Delivery: 12/31/11 admitted at 40/[redacted] weeks gestation for version and induction.  Patient is s/p 2 vaginal deliveries.  Her last delivery was by cesarean for placenta previa.  She desires TOLAC.  In the office yesterday she was Breech.  She is admitted today for version and, if successful, induction of labor.  Prenatal Transfer Tool  Maternal Diabetes: No Genetic Screening: Normal Maternal Ultrasounds/Referrals: Normal Fetal Ultrasounds or other Referrals:  None Maternal Substance Abuse:  No Significant Maternal Medications:  None Significant Maternal Lab Results: None Other Significant Pregnancy Complications:  Previous cesarean.  Afebrile, VSS Heart and Lungs: No active disease Abdomen: soft, gravid, EFW SGA. Cervical exam:  4/60  Ultrasound:  Transverse lie with vertex in LLQ (was Breech on ultrasound in office yesterday).  Impression: Unstable lie, post dates, desires VBAC, favorable cervix.  Plan:  Attempt version to vertex and induce if version is successful.

## 2012-01-04 NOTE — Progress Notes (Signed)
Dr Arlyce Dice called per phone.  Informed of fetal decelerations, and interventions (IV bolus).  Requested MD to come to room

## 2012-01-04 NOTE — Progress Notes (Signed)
Pt contracting from when toco started to graph upward and still contracting when toco tracing below  baseline

## 2012-01-04 NOTE — Progress Notes (Signed)
Pt emotional and having issues with comfort.  RN asked pt if she wanted infant skin to skin or for baby to be wrapped and given to dad.  Pt opted to have dad hold infant

## 2012-01-04 NOTE — Anesthesia Procedure Notes (Signed)
Epidural Patient location during procedure: OB Start time: 01/04/2012 12:46 PM  Staffing Anesthesiologist: Brayton Caves R Performed by: anesthesiologist   Preanesthetic Checklist Completed: patient identified, site marked, surgical consent, pre-op evaluation, timeout performed, IV checked, risks and benefits discussed and monitors and equipment checked  Epidural Patient position: sitting Prep: site prepped and draped and DuraPrep Patient monitoring: continuous pulse ox and blood pressure Approach: midline Injection technique: LOR air and LOR saline  Needle:  Needle type: Tuohy  Needle gauge: 17 G Needle length: 9 cm and 9 Needle insertion depth: 5 cm cm Catheter type: closed end flexible Catheter size: 19 Gauge Catheter at skin depth: 10 cm Test dose: negative  Assessment Events: blood not aspirated, injection not painful, no injection resistance, negative IV test and no paresthesia  Additional Notes Patient identified.  Risk benefits discussed including failed block, incomplete pain control, headache, nerve damage, paralysis, blood pressure changes, nausea, vomiting, reactions to medication both toxic or allergic, and postpartum back pain.  Patient expressed understanding and wished to proceed.  All questions were answered.  Sterile technique used throughout procedure and epidural site dressed with sterile barrier dressing. No paresthesia or other complications noted.The patient did not experience any signs of intravascular injection such as tinnitus or metallic taste in mouth nor signs of intrathecal spread such as rapid motor block. Please see nursing notes for vital signs.

## 2012-01-04 NOTE — Progress Notes (Signed)
Dr Sheral Apley informed that c/section has been called

## 2012-01-04 NOTE — Progress Notes (Signed)
Patient was verted easily from back up transverse lie to vertex.  AROM was performed while the vertex was fixed over the pelvic inlet with lower abdominal pressure.  IV pitocin ordered for induction.  FHT's reactive.

## 2012-01-04 NOTE — OR Nursing (Addendum)
Uterus massaged by S.  Charity fundraiser. Two tubes of cord blood sent to lab. Foley catheter in place upon arrival to OR. Urine color-clear.  30 of blood evacuated from uterus during uterine massage. No lidocaine was injected into abdomen but was placed on table per Dr. Johnnette Gourd order to do so.

## 2012-01-04 NOTE — Progress Notes (Signed)
Pt up to bathroom and voided prior to epidural 

## 2012-01-05 LAB — CBC
HCT: 32.2 % — ABNORMAL LOW (ref 36.0–46.0)
Hemoglobin: 10.8 g/dL — ABNORMAL LOW (ref 12.0–15.0)
MCV: 92 fL (ref 78.0–100.0)
WBC: 11.3 10*3/uL — ABNORMAL HIGH (ref 4.0–10.5)

## 2012-01-05 NOTE — Anesthesia Postprocedure Evaluation (Signed)
  Anesthesia Post-op Note  Patient: Jo [name]  Procedure(s) Performed: Procedure(s) (LRB) with comments: CESAREAN SECTION (N/A) - Repeat cesarean section with delivery of baby boy at 1344. Apgars 9/9.  Patient Location: Mother/Baby  Anesthesia Type:Epidural  Level of Consciousness: awake, alert  and oriented  Airway and Oxygen Therapy: Patient Spontanous Breathing  Post-op Pain: mild  Post-op Assessment: Patient's Cardiovascular Status Stable, Respiratory Function Stable, Patent Airway, No signs of Nausea or vomiting and Pain level controlled  Post-op Vital Signs: stable  Complications: No apparent anesthesia complications

## 2012-01-05 NOTE — Progress Notes (Signed)
Post Op Day 1 Subjective: no complaints  Objective: Blood pressure 106/69, pulse 64, temperature 98.4 F (36.9 C), temperature source Oral, resp. rate 18, height 5\' 1"  (1.549 m), weight 152 lb (68.947 kg), SpO2 96.00%,  breastfeeding.  Physical Exam:  General: alert Lochia: appropriate Uterine Fundus: firm Incision: no significant drainage   Basename 01/05/12 0541 01/04/12 0845  HGB 10.8* 13.1  HCT 32.2* 38.2    Assessment/Plan: Plan for discharge tomorrow if she is stable and requests early discharge. Circumcision done on newborn.   LOS: 1 day   , D 01/05/2012, 10:43 AM

## 2012-01-06 ENCOUNTER — Encounter (HOSPITAL_COMMUNITY): Payer: Self-pay | Admitting: Obstetrics & Gynecology

## 2012-01-06 NOTE — Progress Notes (Signed)
POD#2 Pt without c/o. Lochia-wnl VSSAF Incision-healing well IMP/ stable PLAN/ routine care.

## 2012-01-07 ENCOUNTER — Other Ambulatory Visit (HOSPITAL_COMMUNITY): Payer: 59

## 2012-01-07 NOTE — Discharge Summary (Signed)
Jo [name], Jo [name]           ACCOUNT NO.:  [id number]  MEDICAL RECORD NO.:  [id number]  LOCATION:  9129                          FACILITY:  [facility]  PHYSICIAN:  Malva Limes, M.D.    DATE OF BIRTH:  December 17, 1975  DATE OF ADMISSION:  01/04/2012 DATE OF DISCHARGE:  01/06/2012                              DISCHARGE SUMMARY   PRINCIPAL DISCHARGE DIAGNOSES: 1. Intrauterine pregnancy at term. 2. History of prior cesarean section 3. The patient desired a trial of labor.  PRINCIPAL PROCEDURES: 1. Attempt trial of labor 2. Repeat cesarean section.  HISTORY OF PRESENT ILLNESS:  [name] is a 36 year old black female, G62, P2-1-0-3, [facility] December 31, 2011 who was admitted at 40-1/2 weeks estimated gestational age for aversion and induction if she was successfully verted.  The patient had a history of 2 spontaneous vaginal deliveries.  Her C-section was because of placenta previa.  HOSPITAL COURSE:  The patient was admitted.  She underwent ultrasound and was found to have a transverse lie.  There was aversion performed by Dr. Ilda Mori to the vertex presentation.  At that point, an amniotomy was performed with clear fluid and Pitocin augmentation begun. The patient developed a nonreassuring fetal heart rate tracing with severe repetitive variable deceleration.  In light of this, she was taken to the operating room for repeat cesarean section.  The patient delivered 1 live viable black female infant.  Apgars 9 at 1 minute, 9 at 5 minutes.  The weight was 6 pounds 12 ounces.  There was a tight nuchal cord x2.  There was also noted to be leiomyomata on the right fundus.  A complete description of this operative procedure can be found in dictated operative note.  The patient's postoperative course was benign. Her postop hemoglobin was 10.8, preop 13.1.  On postop day #2, the patient's incision appeared to be healing well.  She was ambulating without difficulty.  She had adequate pain  control and she was tolerating a regular diet.  The patient expressed a desire to go home. She was discharged to home on postoperative day #2.  She was instructed to follow up in the office in 4 weeks.  She was sent home with Percocet to take p.r.n.  She was told that she could also use Advil as needed. The baby was circumcised prior to discharge.          ______________________________ Malva Limes, M.D.     MA/MEDQ  D:  01/06/2012  T:  01/07/2012  Job:  [id number]

## 2012-01-09 ENCOUNTER — Inpatient Hospital Stay (HOSPITAL_COMMUNITY): Admission: RE | Admit: 2012-01-09 | Payer: 59 | Source: Ambulatory Visit | Admitting: Obstetrics and Gynecology

## 2012-01-09 SURGERY — Surgical Case
Anesthesia: Regional

## 2012-01-13 ENCOUNTER — Telehealth (HOSPITAL_COMMUNITY): Payer: Self-pay | Admitting: *Deleted

## 2012-01-13 NOTE — Telephone Encounter (Signed)
Resolve episode 

## 2012-06-18 ENCOUNTER — Other Ambulatory Visit: Payer: Self-pay | Admitting: Obstetrics and Gynecology

## 2013-12-27 ENCOUNTER — Encounter (HOSPITAL_COMMUNITY): Payer: Self-pay | Admitting: Obstetrics & Gynecology

## 2014-04-14 ENCOUNTER — Other Ambulatory Visit: Payer: Self-pay | Admitting: Obstetrics and Gynecology

## 2014-04-15 LAB — CYTOLOGY - PAP

## 2014-07-05 ENCOUNTER — Other Ambulatory Visit: Payer: Self-pay | Admitting: Obstetrics and Gynecology

## 2016-05-15 ENCOUNTER — Other Ambulatory Visit: Payer: Self-pay | Admitting: Obstetrics and Gynecology

## 2016-05-15 DIAGNOSIS — Z6824 Body mass index (BMI) 24.0-24.9, adult: Secondary | ICD-10-CM | POA: Diagnosis not present

## 2016-05-15 DIAGNOSIS — Z01419 Encounter for gynecological examination (general) (routine) without abnormal findings: Secondary | ICD-10-CM | POA: Diagnosis not present

## 2016-05-15 DIAGNOSIS — Z1231 Encounter for screening mammogram for malignant neoplasm of breast: Secondary | ICD-10-CM | POA: Diagnosis not present

## 2016-05-15 DIAGNOSIS — Z124 Encounter for screening for malignant neoplasm of cervix: Secondary | ICD-10-CM | POA: Diagnosis not present

## 2016-05-16 LAB — CYTOLOGY - PAP

## 2016-09-15 ENCOUNTER — Emergency Department (HOSPITAL_COMMUNITY)
Admission: EM | Admit: 2016-09-15 | Discharge: 2016-09-15 | Disposition: A | Discharge status: Home / Self Care | Payer: 59 | Attending: Emergency Medicine | Admitting: Emergency Medicine

## 2016-09-15 ENCOUNTER — Encounter (HOSPITAL_COMMUNITY): Payer: Self-pay | Admitting: *Deleted

## 2016-09-15 ENCOUNTER — Emergency Department (HOSPITAL_COMMUNITY): Payer: 59

## 2016-09-15 DIAGNOSIS — R0789 Other chest pain: Secondary | ICD-10-CM | POA: Diagnosis not present

## 2016-09-15 DIAGNOSIS — R079 Chest pain, unspecified: Secondary | ICD-10-CM | POA: Diagnosis not present

## 2016-09-15 LAB — CBC
HEMATOCRIT: 30.8 % — AB (ref 36.0–46.0)
Hemoglobin: 8.9 g/dL — ABNORMAL LOW (ref 12.0–15.0)
MCH: 21 pg — ABNORMAL LOW (ref 26.0–34.0)
MCHC: 28.9 g/dL — ABNORMAL LOW (ref 30.0–36.0)
MCV: 72.6 fL — AB (ref 78.0–100.0)
Platelets: 284 10*3/uL (ref 150–400)
RBC: 4.24 MIL/uL (ref 3.87–5.11)
RDW: 17.9 % — ABNORMAL HIGH (ref 11.5–15.5)
WBC: 5.5 10*3/uL (ref 4.0–10.5)

## 2016-09-15 LAB — BASIC METABOLIC PANEL
ANION GAP: 7 (ref 5–15)
BUN: 10 mg/dL (ref 6–20)
CO2: 24 mmol/L (ref 22–32)
Calcium: 9.8 mg/dL (ref 8.9–10.3)
Chloride: 107 mmol/L (ref 101–111)
Creatinine, Ser: 0.78 mg/dL (ref 0.44–1.00)
GFR calc Af Amer: >60 mL/min (ref >60)
Glucose, Bld: 93 mg/dL (ref 65–99)
Potassium: 3.8 mmol/L (ref 3.5–5.1)
Sodium: 138 mmol/L (ref 135–145)

## 2016-09-15 LAB — TROPONIN I: Troponin I: <0.03 ng/mL (ref <0.03)

## 2016-09-15 MED ORDER — TRAMADOL HCL 50 MG PO TABS: 50.0000 mg | ORAL_TABLET | Freq: Four times a day (QID) | ORAL | 0 refills | Status: DC | PRN

## 2016-09-15 MED ORDER — HYDROCODONE-ACETAMINOPHEN 5-325 MG PO TABS
2.0000 | ORAL_TABLET | Freq: Once | ORAL | Status: AC
  Administered 2016-09-15: 2 via ORAL
  Filled 2016-09-15: qty 2

## 2016-09-15 MED ORDER — NAPROXEN 500 MG PO TABS: 500.0000 mg | ORAL_TABLET | Freq: Two times a day (BID) | ORAL | 0 refills | Status: DC

## 2016-09-15 NOTE — ED Provider Notes (Signed)
[facility] DEPT Provider Note   CSN: [id number] Arrival date & time: 09/15/16  0028  By signing my name below, I, Margit Banda, attest that this documentation has been prepared under the direction and in the presence of Veryl Speak, MD. Electronically Signed: Margit Banda, ED Scribe. 09/15/16. 1:09 AM.  History   Chief Complaint Chief Complaint  Patient presents with  . Chest Pain    HPI Jo [name] is a 41 y.o. female who presents to the Emergency Department complaining of sudden, tightness, throbbing, 10/10 chest pain that started on Thursday 09/12/16. Pain has radiated to her back. Movement and deep breaths exacerbate her pain. She has taken 3 ibuprofen with mild to no relief. Never had pain like this before. Doesn't smoke. FHx of heart disease. No hx of HTN or heart issues. Pt denies cough, rhinorrhea, and fever.  The history is provided by the patient. No language interpreter was used.  Chest Pain   This is a new problem. The current episode started 2 days ago. The problem occurs daily. The problem has been gradually worsening. The pain is associated with movement and breathing. The pain is moderate. The pain radiates to the mid back, upper back and lower back. The symptoms are aggravated by deep breathing and exertion. Associated symptoms include back pain. Pertinent negatives include no cough and no fever.  Her past medical history is significant for seizures.  Her family medical history is significant for CAD, diabetes, heart disease and hypertension.    Past Medical History:  Diagnosis Date  . Fibroid   . Pregnancy induced hypertension   . Seizures ([place])     There are no active problems to display for this patient.   Past Surgical History:  Procedure Laterality Date  . CESAREAN SECTION    . CESAREAN SECTION  01/04/2012   Procedure: CESAREAN SECTION;  Surgeon: Sharene Butters, MD;  Location: [facility];  Service: Obstetrics;  Laterality: N/A;  Repeat  cesarean section with delivery of baby boy at 6. Apgars 9/9.    OB History    Gravida Para Term Preterm AB Living   4 4 3 1   4    SAB TAB Ectopic Multiple Live Births           4       Home Medications    Prior to Admission medications   Not on File    Family History Family History  Problem Relation Age of Onset  . Diabetes Father   . Heart disease Father   . Hypertension Father   . Diabetes Maternal Aunt   . Hypertension Maternal Aunt   . Hypertension Paternal Uncle   . Cancer Maternal Grandmother        Colon  . Diabetes Maternal Grandmother   . Hypertension Paternal Grandmother   . Heart disease Paternal Grandmother     Social History Social History  Substance Use Topics  . Smoking status: Never Smoker  . Smokeless tobacco: Never Used  . Alcohol use No     Allergies   Patient has no known allergies.   Review of Systems Review of Systems  Constitutional: Negative for fever.  HENT: Negative for rhinorrhea.   Respiratory: Negative for cough.   Cardiovascular: Positive for chest pain.  Musculoskeletal: Positive for back pain.  Neurological: Positive for seizures.  All other systems reviewed and are negative.    Physical Exam Updated Vital Signs BP (!) 142/97   Pulse 89   Ht 5\' 1"  (1.549  m)   Wt 128 lb (58.1 kg)   LMP 09/01/2016   SpO2 99%   BMI 24.19 kg/m   Physical Exam  Constitutional: She is oriented to person, place, and time. She appears well-developed and well-nourished. No distress.  HENT:  Head: Normocephalic and atraumatic.  Mouth/Throat: Oropharynx is clear and moist. No oropharyngeal exudate.  Eyes: Pupils are equal, round, and reactive to light. Conjunctivae and EOM are normal.  Neck: Normal range of motion. Neck supple.  No meningismus.  Cardiovascular: Normal rate, regular rhythm, normal heart sounds and intact distal pulses.   No murmur heard. Pulmonary/Chest: Effort normal and breath sounds normal. No respiratory  distress. She exhibits tenderness.  TTP in the left anterior chest wall.  Abdominal: Soft. There is no tenderness. There is no rebound and no guarding.  Musculoskeletal: Normal range of motion. She exhibits no edema or tenderness.  Neurological: She is alert and oriented to person, place, and time. No cranial nerve deficit. She exhibits normal muscle tone. Coordination normal.   5/5 strength throughout. CN 2-12 intact.Equal grip strength.   Skin: Skin is warm.  Psychiatric: She has a normal mood and affect. Her behavior is normal.  Nursing note and vitals reviewed.    ED Treatments / Results  DIAGNOSTIC STUDIES: Oxygen Saturation is 99% on RA, normal by my interpretation.   COORDINATION OF CARE: 1:09 AM-Discussed next steps with pt which includes and XR. Pt verbalized understanding and is agreeable with the plan.   Labs (all labs ordered are listed, but only abnormal results are displayed) Labs Reviewed  CBC - Abnormal; Notable for the following:       Result Value   Hemoglobin 8.9 (*)    HCT 30.8 (*)    MCV 72.6 (*)    MCH 21.0 (*)    MCHC 28.9 (*)    RDW 17.9 (*)    All other components within normal limits  BASIC METABOLIC PANEL  TROPONIN I    EKG  EKG Interpretation  Date/Time:  Sunday September 15 2016 00:28:48 EDT Ventricular Rate:  84 PR Interval:  164 QRS Duration: 68 QT Interval:  360 QTC Calculation: 425 R Axis:   60 Text Interpretation:  Normal sinus rhythm with sinus arrhythmia Normal ECG Confirmed by Veryl Speak [id number]) on 09/15/2016 1:01:30 AM       Radiology No results found.  Procedures Procedures (including critical care time)  Medications Ordered in ED Medications - No data to display   Initial Impression / Assessment and Plan / ED Course  I have reviewed the triage vital signs and the nursing notes.  Pertinent labs & imaging results that were available during my care of the patient were reviewed by me and considered in my medical  decision making (see chart for details).  Patient presents with complaints of pain in the left chest that appears very musculoskeletal in nature. It is worse with palpation, movement, and change in position. This started after she was removing a pool cover while at work.  Workup reveals no evidence for a cardiac etiology. Her EKG is normal and troponin is negative. Chest x-ray is clear. I see no indication for any further workup at this time. She will be discharged, to return as needed for any problems. She does have an anemia with a hemoglobin of 8.9. She has a low MCV and I suspect that this is chronic. She is to follow this up with her primary doctor.  Final Clinical Impressions(s) / ED Diagnoses  Final diagnoses:  None    New Prescriptions New Prescriptions   No medications on file   I personally performed the services described in this documentation, which was scribed in my presence. The recorded information has been reviewed and is accurate.       Veryl Speak, MD 09/15/16 [phone]

## 2016-09-15 NOTE — ED Notes (Signed)
Pt verbalized understanding of discharge instructions.

## 2016-09-15 NOTE — ED Notes (Signed)
Patient transported to X-ray 

## 2016-09-15 NOTE — Discharge Instructions (Signed)
Naproxen as prescribed.  Tramadol as prescribed as needed for pain not relieved with naproxen.  Follow up with her primary Dr. if not improving in the next week, and return to the ER if your symptoms significantly worsen or change.

## 2016-09-15 NOTE — ED Triage Notes (Signed)
The pt is c/o lt chest tightness for 2 days after lifting something heavy at work.  Worse pain tonight  No previous history.  lmp sunday

## 2016-09-27 DIAGNOSIS — C411 Malignant neoplasm of mandible: Secondary | ICD-10-CM | POA: Diagnosis not present

## 2016-10-03 ENCOUNTER — Ambulatory Visit: Payer: 59 | Admitting: Hematology and Oncology

## 2016-10-03 ENCOUNTER — Encounter: Payer: Self-pay | Admitting: Hematology and Oncology

## 2016-10-03 ENCOUNTER — Ambulatory Visit (HOSPITAL_BASED_OUTPATIENT_CLINIC_OR_DEPARTMENT_OTHER): Payer: 59 | Admitting: Hematology and Oncology

## 2016-10-03 DIAGNOSIS — C50919 Malignant neoplasm of unspecified site of unspecified female breast: Secondary | ICD-10-CM | POA: Insufficient documentation

## 2016-10-03 DIAGNOSIS — D500 Iron deficiency anemia secondary to blood loss (chronic): Secondary | ICD-10-CM | POA: Diagnosis not present

## 2016-10-03 DIAGNOSIS — C410 Malignant neoplasm of bones of skull and face: Secondary | ICD-10-CM | POA: Diagnosis not present

## 2016-10-03 DIAGNOSIS — N939 Abnormal uterine and vaginal bleeding, unspecified: Secondary | ICD-10-CM

## 2016-10-03 NOTE — Progress Notes (Signed)
[facility] CONSULT NOTE  Patient Care Team: Patient, No Pcp Per as PCP - General (General Practice)  CHIEF COMPLAINTS/PURPOSE OF CONSULTATION:  Iron deficiency anemia, Jaw lesion biopsy was concerning for breast cancer  HISTORY OF PRESENTING ILLNESS:  Jo [name] 41 y.o. female is here urgently because of a recent jaw/dental biopsy which suggested that she may have breast cancer. After she arrived, I called the [facility] dental pathology department and spoke to the pathologist who just received the immunostains back for ER/PR and HER-2/neu. All of these stains were negative. She does not think that the patient has breast cancer. However additional immunostains are being ordered. This is most likely not cancer according to the pathologist. Incidentally on blood work review it was found that she has hemoglobin of 8.9 and MCV of 72.6 along with heavy bleeding twice a month from her menstrual cycles. She was previously prescribed oral iron but she could not tolerate that. So she is currently taking woman's multivitamin with contains some iron in it. She has not noticed any major improvement in her energy levels.  I reviewed her records extensively and collaborated the history with the patient.  MEDICAL HISTORY:  Past Medical History:  Diagnosis Date  . Fibroid   . Pregnancy induced hypertension   . Seizures ([place])     SURGICAL HISTORY: Past Surgical History:  Procedure Laterality Date  . CESAREAN SECTION    . CESAREAN SECTION  01/04/2012   Procedure: CESAREAN SECTION;  Surgeon: Sharene Butters, MD;  Location: [facility];  Service: Obstetrics;  Laterality: N/A;  Repeat cesarean section with delivery of baby boy at 69. Apgars 9/9.    SOCIAL HISTORY: Social History   Social History  . Marital status: Married    Spouse name: N/A  . Number of children: N/A  . Years of education: N/A   Occupational History  . Not on file.   Social History Main Topics  . Smoking  status: Never Smoker  . Smokeless tobacco: Never Used  . Alcohol use No  . Drug use: Unknown  . Sexual activity: Yes   Other Topics Concern  . Not on file   Social History Narrative  . No narrative on file    FAMILY HISTORY: Family History  Problem Relation Age of Onset  . Diabetes Father   . Heart disease Father   . Hypertension Father   . Diabetes Maternal Aunt   . Hypertension Maternal Aunt   . Hypertension Paternal Uncle   . Cancer Maternal Grandmother        Colon  . Diabetes Maternal Grandmother   . Hypertension Paternal Grandmother   . Heart disease Paternal Grandmother     ALLERGIES:  has No Known Allergies.  MEDICATIONS:  Current Outpatient Prescriptions  Medication Sig Dispense Refill  . naproxen (NAPROSYN) 500 MG tablet Take 1 tablet (500 mg total) by mouth 2 (two) times daily. 20 tablet 0  . traMADol (ULTRAM) 50 MG tablet Take 1 tablet (50 mg total) by mouth every 6 (six) hours as needed. 15 tablet 0   No current facility-administered medications for this visit.     REVIEW OF SYSTEMS:   Constitutional: Denies fevers, chills or abnormal night sweats, Complains of fatigue Eyes: Denies blurriness of vision, double vision or watery eyes Ears, nose, mouth, throat, and face: Denies mucositis or sore throat Respiratory: Denies cough, dyspnea or wheezes Cardiovascular: Denies palpitation, chest discomfort or lower extremity swelling Gastrointestinal:  Denies nausea, heartburn or change in  bowel habits Skin: Denies abnormal skin rashes Lymphatics: Denies new lymphadenopathy or easy bruising Neurological:Denies numbness, tingling or new weaknesses Behavioral/Psych: Mood is stable, no new changes  Breast:  Denies any palpable lumps or discharge All other systems were reviewed with the patient and are negative.  PHYSICAL EXAMINATION: ECOG PERFORMANCE STATUS: 1 - Symptomatic but completely ambulatory  Vitals:   10/03/16 1606  BP: (!) 131/93  Pulse: 98   Resp: 18  Temp: 98.2 F (36.8 C)  SpO2: 100%   Filed Weights   10/03/16 1606  Weight: 123 lb 3.2 oz (55.9 kg)    GENERAL:alert, no distress and comfortable SKIN: skin color, texture, turgor are normal, no rashes or significant lesions EYES: normal, conjunctiva are pink and non-injected, sclera clear OROPHARYNX:no exudate, no erythema and lips, buccal mucosa, and tongue normal  NECK: supple, thyroid normal size, non-tender, without nodularity LYMPH:  no palpable lymphadenopathy in the cervical, axillary or inguinal LUNGS: clear to auscultation and percussion with normal breathing effort HEART: regular rate & rhythm and no murmurs and no lower extremity edema ABDOMEN:abdomen soft, non-tender and normal bowel sounds Musculoskeletal:no cyanosis of digits and no clubbing  PSYCH: alert & oriented x 3 with fluent speech NEURO: no focal motor/sensory deficits  LABORATORY DATA:  I have reviewed the data as listed Lab Results  Component Value Date   WBC 5.5 09/15/2016   HGB 8.9 (L) 09/15/2016   HCT 30.8 (L) 09/15/2016   MCV 72.6 (L) 09/15/2016   PLT 284 09/15/2016   Lab Results  Component Value Date   NA 138 09/15/2016   K 3.8 09/15/2016   CL 107 09/15/2016   CO2 24 09/15/2016    RADIOGRAPHIC STUDIES: I have personally reviewed the radiological reports and agreed with the findings in the report.  ASSESSMENT AND PLAN:   Granulation tissue at the site of wisdom tooth extraction: Biopsy Was initially felt to be breast cancer but on further review the pathologist informed me that she does not think she has any cancer. Additional immunostains are pending. We will follow-up on the immunostains. I called and informed the results to [facility] who was relieved to hear that she does not have a cancer in the jaw. We need to follow-up on the final pathology report to make sure that is confirmed.  Iron deficiency anemia due to chronic blood loss Blood work performed 2 weeks ago  revealed a hemoglobin of 8.9 and MCV of 72.6. RDW 17.9. Patient has profound uterine bleeding probably related to fibroids. She has periods every 2-1/2 weeks that are heavy. She is intolerant to oral iron therapy. I recommended intravenous iron therapy 2 doses one week apart with Feraheme. She agreed to receive this treatment starting next Wednesday Patient has appointment to see her primary care doctor in December. I instructed that she should get CBC iron studies done at that time. If she is deficient that she will need IV iron.  I would like to see her back in 6 months with recheck of her blood work and follow-up.   All questions were answered. The patient knows to call the clinic with any problems, questions or concerns.    Rulon Eisenmenger, MD 10/03/16

## 2016-10-03 NOTE — Assessment & Plan Note (Signed)
Granulation tissue at the site of wisdom tooth extraction: Biopsy revealed metastatic breast cancer.  Plan: 1. PET/CT scan 2. breast prognostic panel 3. Treatment plan based upon the above

## 2016-10-03 NOTE — Assessment & Plan Note (Signed)
Blood work performed 2 weeks ago revealed a hemoglobin of 8.9 and MCV of 72.6. RDW 17.9. Patient has profound uterine bleeding probably related to fibroids. She has periods every 2-1/2 weeks that are heavy. She is intolerant to oral iron therapy. I recommended intravenous iron therapy 2 doses one week apart with Feraheme. She agreed to receive this treatment starting next Wednesday Patient has appointment to see her primary care doctor in December. I instructed that she should get CBC iron studies done at that time. If she is deficient that she will need IV iron.  I would like to see her back in 6 months with recheck of her blood work and follow-up.

## 2016-10-07 ENCOUNTER — Telehealth: Payer: Self-pay

## 2016-10-07 NOTE — Telephone Encounter (Signed)
Pt is waiting for an additional pathology that was run on specimen and [facility] dental pathology dept. Dr Lindi Adie not in office and he should have received this. This RN lvm at [facility] dental pathology dept [phone] the results, and that they be mailed to the pt per her request.  She was asking about her feraheme having a prior auth before she receives it on Wednesday 8/15. No PA noted, request sent to LuAnn Chrismon.   Received reply from Aleen Sells and feraheme has PA done.

## 2016-10-08 NOTE — Telephone Encounter (Signed)
lvm at [facility] for immunostain results. Talked w/pt that feraheme is approved. Still trying to get results from [facility]

## 2016-10-09 ENCOUNTER — Other Ambulatory Visit (HOSPITAL_BASED_OUTPATIENT_CLINIC_OR_DEPARTMENT_OTHER): Payer: 59

## 2016-10-09 ENCOUNTER — Ambulatory Visit (HOSPITAL_BASED_OUTPATIENT_CLINIC_OR_DEPARTMENT_OTHER): Payer: 59

## 2016-10-09 VITALS — BP 126/85 | HR 81 | Temp 98.4°F | Resp 18

## 2016-10-09 DIAGNOSIS — D500 Iron deficiency anemia secondary to blood loss (chronic): Secondary | ICD-10-CM | POA: Diagnosis not present

## 2016-10-09 LAB — CBC WITH DIFFERENTIAL/PLATELET
BASO%: 1.2 % (ref 0.0–2.0)
Basophils Absolute: 0.1 10*3/uL (ref 0.0–0.1)
EOS ABS: 0.1 10*3/uL (ref 0.0–0.5)
EOS%: 1.6 % (ref 0.0–7.0)
HCT: 28.9 % — ABNORMAL LOW (ref 34.8–46.6)
HGB: 8.8 g/dL — ABNORMAL LOW (ref 11.6–15.9)
LYMPH%: 39.2 % (ref 14.0–49.7)
MCH: 21.1 pg — AB (ref 25.1–34.0)
MCHC: 30.4 g/dL — AB (ref 31.5–36.0)
MCV: 69.3 fL — AB (ref 79.5–101.0)
MONO#: 0.2 10*3/uL (ref 0.1–0.9)
MONO%: 5 % (ref 0.0–14.0)
NEUT%: 53 % (ref 38.4–76.8)
NEUTROS ABS: 2.4 10*3/uL (ref 1.5–6.5)
PLATELETS: 284 10*3/uL (ref 145–400)
RBC: 4.17 10*6/uL (ref 3.70–5.45)
RDW: 19.2 % — ABNORMAL HIGH (ref 11.2–14.5)
WBC: 4.4 10*3/uL (ref 3.9–10.3)
lymph#: 1.7 10*3/uL (ref 0.9–3.3)

## 2016-10-09 LAB — IRON AND TIBC
%SAT: 4 % — ABNORMAL LOW (ref 21–57)
IRON: 15 ug/dL — AB (ref 41–142)
TIBC: 390 ug/dL (ref 236–444)
UIBC: 375 ug/dL (ref 120–384)

## 2016-10-09 LAB — FERRITIN

## 2016-10-09 MED ORDER — FERUMOXYTOL INJECTION 510 MG/17 ML
510.0000 mg | Freq: Once | INTRAVENOUS | Status: AC
  Administered 2016-10-09: 510 mg via INTRAVENOUS
  Filled 2016-10-09: qty 17

## 2016-10-09 MED ORDER — SODIUM CHLORIDE 0.9 % IV SOLN
Freq: Once | INTRAVENOUS | Status: AC
  Administered 2016-10-09: 14:00:00 via INTRAVENOUS

## 2016-10-09 NOTE — Patient Instructions (Addendum)
Ferumoxytol injection What is this medicine? FERUMOXYTOL is an iron complex. Iron is used to make healthy red blood cells, which carry oxygen and nutrients throughout the body. This medicine is used to treat iron deficiency anemia in people with chronic kidney disease. This medicine may be used for other purposes; ask your health care provider or pharmacist if you have questions. COMMON BRAND NAME(S): Feraheme What should I tell my health care provider before I take this medicine? They need to know if you have any of these conditions: -anemia not caused by low iron levels -high levels of iron in the blood -magnetic resonance imaging (MRI) test scheduled -an unusual or allergic reaction to iron, other medicines, foods, dyes, or preservatives -pregnant or trying to get pregnant -breast-feeding How should I use this medicine? This medicine is for injection into a vein. It is given by a health care professional in a hospital or clinic setting. Talk to your pediatrician regarding the use of this medicine in children. Special care may be needed. Overdosage: If you think you have taken too much of this medicine contact a poison control center or emergency room at once. NOTE: This medicine is only for you. Do not share this medicine with others. What if I miss a dose? It is important not to miss your dose. Call your doctor or health care professional if you are unable to keep an appointment. What may interact with this medicine? This medicine may interact with the following medications: -other iron products This list may not describe all possible interactions. Give your health care provider a list of all the medicines, herbs, non-prescription drugs, or dietary supplements you use. Also tell them if you smoke, drink alcohol, or use illegal drugs. Some items may interact with your medicine. What should I watch for while using this medicine? Visit your doctor or healthcare professional regularly. Tell  your doctor or healthcare professional if your symptoms do not start to get better or if they get worse. You may need blood work done while you are taking this medicine. You may need to follow a special diet. Talk to your doctor. Foods that contain iron include: whole grains/cereals, dried fruits, beans, or peas, leafy green vegetables, and organ meats (liver, kidney). What side effects may I notice from receiving this medicine? Side effects that you should report to your doctor or health care professional as soon as possible: -allergic reactions like skin rash, itching or hives, swelling of the face, lips, or tongue -breathing problems -changes in blood pressure -feeling faint or lightheaded, falls -fever or chills -flushing, sweating, or hot feelings -swelling of the ankles or feet Side effects that usually do not require medical attention (report to your doctor or health care professional if they continue or are bothersome): -diarrhea -headache -nausea, vomiting -stomach pain This list may not describe all possible side effects. Call your doctor for medical advice about side effects. You may report side effects to FDA at 1-800-FDA-1088. Where should I keep my medicine? This drug is given in a hospital or clinic and will not be stored at home. NOTE: This sheet is a summary. It may not cover all possible information. If you have questions about this medicine, talk to your doctor, pharmacist, or health care provider.  2018 Elsevier/Gold Standard (2015-03-16 12:41:49)   Iron-Rich Diet Iron is a mineral that helps your body to produce hemoglobin. Hemoglobin is a protein in your red blood cells that carries oxygen to your body's tissues. Eating too little iron may  cause you to feel weak and tired, and it can increase your risk for infection. Eating enough iron is necessary for your body's metabolism, muscle function, and nervous system. Iron is naturally found in many foods. It can also be  added to foods or fortified in foods. There are two types of dietary iron:  Heme iron. Heme iron is absorbed by the body more easily than nonheme iron. Heme iron is found in meat, poultry, and fish.  Nonheme iron. Nonheme iron is found in dietary supplements, iron-fortified grains, beans, and vegetables.  You may need to follow an iron-rich diet if:  You have been diagnosed with iron deficiency or iron-deficiency anemia.  You have a condition that prevents you from absorbing dietary iron, such as: ? Infection in your intestines. ? Celiac disease. This involves long-lasting (chronic) inflammation of your intestines.  You do not eat enough iron.  You eat a diet that is high in foods that impair iron absorption.  You have lost a lot of blood.  You have heavy bleeding during your menstrual cycle.  You are pregnant.  What is my plan? Your health care provider may help you to determine how much iron you need per day based on your condition. Generally, when a person consumes sufficient amounts of iron in the diet, the following iron needs are met:  Men. ? 14-21 years old: 11 mg per day. ? 39-40 years old: 8 mg per day.  Women. ? 42-56 years old: 15 mg per day. ? 75-47 years old: 18 mg per day. ? Over 35 years old: 8 mg per day. ? Pregnant women: 27 mg per day. ? Breastfeeding women: 9 mg per day.  What do I need to know about an iron-rich diet?  Eat fresh fruits and vegetables that are high in vitamin C along with foods that are high in iron. This will help increase the amount of iron that your body absorbs from food, especially with foods containing nonheme iron. Foods that are high in vitamin C include oranges, peppers, tomatoes, and mango.  Take iron supplements only as directed by your health care provider. Overdose of iron can be life-threatening. If you were prescribed iron supplements, take them with orange juice or a vitamin C supplement.  Cook foods in pots and pans that  are made from iron.  Eat nonheme iron-containing foods alongside foods that are high in heme iron. This helps to improve your iron absorption.  Certain foods and drinks contain compounds that impair iron absorption. Avoid eating these foods in the same meal as iron-rich foods or with iron supplements. These include: ? Coffee, black tea, and red wine. ? Milk, dairy products, and foods that are high in calcium. ? Beans, soybeans, and peas. ? Whole grains.  When eating foods that contain both nonheme iron and compounds that impair iron absorption, follow these tips to absorb iron better. ? Soak beans overnight before cooking. ? Soak whole grains overnight and drain them before using. ? Ferment flours before baking, such as using yeast in bread dough. What foods can I eat? Grains Iron-fortified breakfast cereal. Iron-fortified whole-wheat bread. Enriched rice. Sprouted grains. Vegetables Spinach. Potatoes with skin. Green peas. Broccoli. Red and green bell peppers. Fermented vegetables. Fruits Prunes. Raisins. Oranges. Strawberries. Mango. Grapefruit. Meats and Other Protein Sources Beef liver. Oysters. Beef. Shrimp. [place]. Chicken. [place]. Sardines. Chickpeas. Nuts. Tofu. Beverages Tomato juice. Fresh orange juice. Prune juice. Hibiscus tea. Fortified instant breakfast shakes. Condiments Tahini. Fermented soy sauce. Sweets and  Desserts Black-strap molasses. Other Wheat germ. The items listed above may not be a complete list of recommended foods or beverages. Contact your dietitian for more options. What foods are not recommended? Grains Whole grains. Bran cereal. Bran flour. Oats. Vegetables Artichokes. Brussels sprouts. Kale. Fruits Blueberries. Raspberries. Strawberries. Figs. Meats and Other Protein Sources Soybeans. Products made from soy protein. Dairy Milk. Cream. Cheese. Yogurt. Cottage cheese. Beverages Coffee. Black tea. Red wine. Sweets and Desserts Cocoa.  Chocolate. Ice cream. Other Basil. Oregano. Parsley. The items listed above may not be a complete list of foods and beverages to avoid. Contact your dietitian for more information. This information is not intended to replace advice given to you by your health care provider. Make sure you discuss any questions you have with your health care provider. Document Released: 09/25/2004 Document Revised: 09/01/2015 Document Reviewed: 09/08/2013 Elsevier Interactive Patient Education  [employer].

## 2016-10-15 ENCOUNTER — Telehealth: Payer: Self-pay

## 2016-10-15 ENCOUNTER — Other Ambulatory Visit: Payer: Self-pay

## 2016-10-15 DIAGNOSIS — D500 Iron deficiency anemia secondary to blood loss (chronic): Secondary | ICD-10-CM

## 2016-10-15 NOTE — Telephone Encounter (Signed)
Pt calling to follow up on her biopsy results from oral patholgy at [facility]. Called [facility] today and lvm with call back number. No results were received as of today. Notified pt that we will follow up and let her know as soon as possible.Pt verbalized understanding.

## 2016-10-16 ENCOUNTER — Telehealth: Payer: Self-pay

## 2016-10-16 ENCOUNTER — Encounter (INDEPENDENT_AMBULATORY_CARE_PROVIDER_SITE_OTHER): Payer: Self-pay

## 2016-10-16 ENCOUNTER — Ambulatory Visit (HOSPITAL_BASED_OUTPATIENT_CLINIC_OR_DEPARTMENT_OTHER): Payer: 59

## 2016-10-16 ENCOUNTER — Other Ambulatory Visit (HOSPITAL_BASED_OUTPATIENT_CLINIC_OR_DEPARTMENT_OTHER): Payer: 59

## 2016-10-16 VITALS — BP 128/90 | HR 95 | Temp 98.5°F | Resp 17 | Ht 61.0 in

## 2016-10-16 DIAGNOSIS — D500 Iron deficiency anemia secondary to blood loss (chronic): Secondary | ICD-10-CM | POA: Diagnosis not present

## 2016-10-16 DIAGNOSIS — N939 Abnormal uterine and vaginal bleeding, unspecified: Secondary | ICD-10-CM

## 2016-10-16 LAB — CBC WITH DIFFERENTIAL/PLATELET
BASO%: 0.9 % (ref 0.0–2.0)
Basophils Absolute: 0.1 10*3/uL (ref 0.0–0.1)
EOS%: 1.8 % (ref 0.0–7.0)
Eosinophils Absolute: 0.1 10*3/uL (ref 0.0–0.5)
HCT: 35.8 % (ref 34.8–46.6)
HGB: 10.9 g/dL — ABNORMAL LOW (ref 11.6–15.9)
LYMPH%: 30 % (ref 14.0–49.7)
MCH: 22.4 pg — ABNORMAL LOW (ref 25.1–34.0)
MCHC: 30.6 g/dL — AB (ref 31.5–36.0)
MCV: 73.4 fL — ABNORMAL LOW (ref 79.5–101.0)
MONO#: 0.4 10*3/uL (ref 0.1–0.9)
MONO%: 6.7 % (ref 0.0–14.0)
NEUT#: 3.3 10*3/uL (ref 1.5–6.5)
NEUT%: 60.6 % (ref 38.4–76.8)
Platelets: 295 10*3/uL (ref 145–400)
RBC: 4.88 10*6/uL (ref 3.70–5.45)
RDW: 19.7 % — ABNORMAL HIGH (ref 11.2–14.5)
WBC: 5.5 10*3/uL (ref 3.9–10.3)
lymph#: 1.7 10*3/uL (ref 0.9–3.3)

## 2016-10-16 MED ORDER — SODIUM CHLORIDE 0.9 % IV SOLN
510.0000 mg | Freq: Once | INTRAVENOUS | Status: AC
  Administered 2016-10-16: 510 mg via INTRAVENOUS
  Filled 2016-10-16: qty 17

## 2016-10-16 MED ORDER — SODIUM CHLORIDE 0.9 % IV SOLN
Freq: Once | INTRAVENOUS | Status: AC
  Administered 2016-10-16: 16:00:00 via INTRAVENOUS

## 2016-10-16 NOTE — Patient Instructions (Signed)

## 2016-10-16 NOTE — Telephone Encounter (Signed)
This RN called [facility] dental pathology department at [phone] requesting pt's dental pathology report be faxed to this office.  Call was transferred to [place] at [phone].  Spoke with Vaughan Basta directly and requested path report be faxed.  Path report not yet received.  Another call placed to [phone] and lvm requesting report.

## 2016-10-16 NOTE — Telephone Encounter (Signed)
Spoke with pt in the infusion room and informed her that we have not yet received requested pathology report from [facility].  This RN gave pt the contact number for [facility] dental pathology dept and asked her to call them tomorrow and request that they fax reports.  Pt agrees.

## 2016-10-17 LAB — IRON AND TIBC
%SAT: 25 % (ref 21–57)
IRON: 94 ug/dL (ref 41–142)
TIBC: 374 ug/dL (ref 236–444)
UIBC: 280 ug/dL (ref 120–384)

## 2016-10-17 LAB — FERRITIN: FERRITIN: 187 ng/mL (ref 9–269)

## 2016-10-21 NOTE — Progress Notes (Signed)
[facility] Pathology and spoke with Randall Hiss (pathology supervisor) regarding follow up on biopsy path results. Randall Hiss states that Dr. Teryl Lucy (pathologist) had sent some of the studies out to [facility] and unable to tell when they will be getting results back. Pathologist did speak with pt last Friday to notify her of delay in results being available. Results will be faxed as soon as it becomes available.

## 2016-10-31 DIAGNOSIS — C411 Malignant neoplasm of mandible: Secondary | ICD-10-CM | POA: Diagnosis not present

## 2016-12-04 DIAGNOSIS — C069 Malignant neoplasm of mouth, unspecified: Secondary | ICD-10-CM | POA: Diagnosis not present

## 2016-12-04 DIAGNOSIS — C089 Malignant neoplasm of major salivary gland, unspecified: Secondary | ICD-10-CM | POA: Diagnosis not present

## 2016-12-05 DIAGNOSIS — Z23 Encounter for immunization: Secondary | ICD-10-CM | POA: Diagnosis not present

## 2016-12-11 DIAGNOSIS — K1379 Other lesions of oral mucosa: Secondary | ICD-10-CM | POA: Diagnosis not present

## 2016-12-11 DIAGNOSIS — D1030 Benign neoplasm of unspecified part of mouth: Secondary | ICD-10-CM | POA: Diagnosis not present

## 2016-12-11 DIAGNOSIS — G893 Neoplasm related pain (acute) (chronic): Secondary | ICD-10-CM | POA: Diagnosis not present

## 2016-12-11 DIAGNOSIS — C801 Malignant (primary) neoplasm, unspecified: Secondary | ICD-10-CM | POA: Diagnosis not present

## 2016-12-16 ENCOUNTER — Telehealth: Payer: Self-pay

## 2016-12-16 ENCOUNTER — Ambulatory Visit: Payer: 59 | Admitting: Hematology and Oncology

## 2016-12-16 ENCOUNTER — Telehealth: Payer: Self-pay | Admitting: Hematology and Oncology

## 2016-12-16 DIAGNOSIS — C0689 Malignant neoplasm of overlapping sites of other parts of mouth: Secondary | ICD-10-CM | POA: Diagnosis not present

## 2016-12-16 NOTE — Telephone Encounter (Signed)
Called and spoke with pt regarding reason for appointment. According to pt, Dr.Browne from [facility] will be meeting with pt this afternoon and was notified that he would do her surgery. Told pt that she does not need to make an appt with Dr.Gudena if she doesn't need to be seen or followed by him. Pt wants to keep appt today at 3pm just in case Dr.Browne wants pt to follow up with Dr.Gudena for any other reason. Pt confirmed time date of appt this afternoon.

## 2016-12-16 NOTE — Telephone Encounter (Signed)
Spoke with patient regarding her appointment with Dr.Gudena. Patient did not understand why she was being scheduled to see Dr. Lindi Adie. I spoke with May and she is going to to contact the patient regarding why that appointment needed to be scheduled.

## 2016-12-19 DIAGNOSIS — C411 Malignant neoplasm of mandible: Secondary | ICD-10-CM | POA: Diagnosis not present

## 2016-12-19 DIAGNOSIS — C039 Malignant neoplasm of gum, unspecified: Secondary | ICD-10-CM | POA: Diagnosis not present

## 2016-12-19 DIAGNOSIS — C031 Malignant neoplasm of lower gum: Secondary | ICD-10-CM | POA: Diagnosis not present

## 2016-12-19 DIAGNOSIS — G893 Neoplasm related pain (acute) (chronic): Secondary | ICD-10-CM | POA: Diagnosis not present

## 2017-01-07 DIAGNOSIS — C031 Malignant neoplasm of lower gum: Secondary | ICD-10-CM | POA: Diagnosis not present

## 2017-01-07 DIAGNOSIS — C089 Malignant neoplasm of major salivary gland, unspecified: Secondary | ICD-10-CM | POA: Diagnosis not present

## 2017-01-07 DIAGNOSIS — Z431 Encounter for attention to gastrostomy: Secondary | ICD-10-CM | POA: Diagnosis not present

## 2017-01-07 DIAGNOSIS — C801 Malignant (primary) neoplasm, unspecified: Secondary | ICD-10-CM | POA: Diagnosis not present

## 2017-01-07 DIAGNOSIS — T8183XA Persistent postprocedural fistula, initial encounter: Secondary | ICD-10-CM | POA: Diagnosis not present

## 2017-01-07 DIAGNOSIS — C411 Malignant neoplasm of mandible: Secondary | ICD-10-CM | POA: Diagnosis not present

## 2017-01-07 DIAGNOSIS — D62 Acute posthemorrhagic anemia: Secondary | ICD-10-CM | POA: Diagnosis not present

## 2017-01-07 DIAGNOSIS — C039 Malignant neoplasm of gum, unspecified: Secondary | ICD-10-CM | POA: Diagnosis not present

## 2017-01-07 DIAGNOSIS — R569 Unspecified convulsions: Secondary | ICD-10-CM | POA: Diagnosis not present

## 2017-01-07 DIAGNOSIS — D500 Iron deficiency anemia secondary to blood loss (chronic): Secondary | ICD-10-CM | POA: Diagnosis not present

## 2017-01-28 DIAGNOSIS — Z0000 Encounter for general adult medical examination without abnormal findings: Secondary | ICD-10-CM | POA: Diagnosis not present

## 2017-04-17 ENCOUNTER — Other Ambulatory Visit: Payer: Self-pay

## 2017-04-17 ENCOUNTER — Inpatient Hospital Stay: Payer: 59 | Attending: Hematology and Oncology | Admitting: Hematology and Oncology

## 2017-04-17 ENCOUNTER — Inpatient Hospital Stay: Payer: 59

## 2017-04-17 DIAGNOSIS — D500 Iron deficiency anemia secondary to blood loss (chronic): Secondary | ICD-10-CM

## 2017-04-17 DIAGNOSIS — C760 Malignant neoplasm of head, face and neck: Secondary | ICD-10-CM

## 2017-04-17 DIAGNOSIS — R5383 Other fatigue: Secondary | ICD-10-CM | POA: Diagnosis not present

## 2017-04-17 DIAGNOSIS — N939 Abnormal uterine and vaginal bleeding, unspecified: Secondary | ICD-10-CM

## 2017-04-17 LAB — CBC WITH DIFFERENTIAL (CANCER CENTER ONLY)
BASOS PCT: 1 %
Basophils Absolute: 0 10*3/uL (ref 0.0–0.1)
Eosinophils Absolute: 0 10*3/uL (ref 0.0–0.5)
Eosinophils Relative: 1 %
HCT: 29.4 % — ABNORMAL LOW (ref 34.8–46.6)
Hemoglobin: 8.8 g/dL — ABNORMAL LOW (ref 11.6–15.9)
LYMPHS ABS: 1.4 10*3/uL (ref 0.9–3.3)
Lymphocytes Relative: 38 %
MCH: 21.7 pg — AB (ref 25.1–34.0)
MCHC: 30.1 g/dL — AB (ref 31.5–36.0)
MCV: 72.1 fL — AB (ref 79.5–101.0)
MONO ABS: 0.2 10*3/uL (ref 0.1–0.9)
MONOS PCT: 6 %
Neutro Abs: 2 10*3/uL (ref 1.5–6.5)
Neutrophils Relative %: 54 %
Platelet Count: 279 10*3/uL (ref 145–400)
RBC: 4.08 MIL/uL (ref 3.70–5.45)
RDW: 17.4 % — AB (ref 11.2–14.5)
WBC Count: 3.8 10*3/uL — ABNORMAL LOW (ref 3.9–10.3)

## 2017-04-17 NOTE — Progress Notes (Signed)
Patient Care Team: Patient, No Pcp Per as PCP - General (General Practice)  DIAGNOSIS:  Encounter Diagnosis  Name Primary?  . 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  She also had surgery on the left neck for mucoepidermoid carcinoma.  This was done at [facility].  She apparently lost some blood during the surgery as well.  She is here today to recheck her blood work and follow-up.  She is accompanied by her husband.  She reports that she does feel fatigued.  She has had normal menstrual cycles a couple of months but over the past 2 months her menstrual cycles were more heavier as well.  She is intolerant to oral iron therapy.  REVIEW OF SYSTEMS:   Constitutional: Denies fevers, chills or abnormal weight loss Eyes: Denies blurriness of vision Ears, nose, mouth, throat, and face: Scar on the left side of the neck is healed very well. Respiratory: Denies cough, dyspnea or wheezes Cardiovascular: Denies palpitation, chest discomfort Gastrointestinal:  Denies nausea, heartburn or change in bowel habits Skin: Denies abnormal skin rashes Lymphatics: Denies new lymphadenopathy or easy bruising Neurological:Denies numbness, tingling or new weaknesses Behavioral/Psych: Mood is stable, no new changes  Extremities: No lower extremity edema  All other systems were reviewed with the patient and are negative.  I have reviewed the past medical history, past surgical history, social history and family history with the patient and they are unchanged from previous note.  ALLERGIES:  has No Known Allergies.  MEDICATIONS:  Current Outpatient Medications  Medication Sig Dispense Refill  . naproxen  (NAPROSYN) 500 MG tablet Take 1 tablet (500 mg total) by mouth 2 (two) times daily. 20 tablet 0  . traMADol (ULTRAM) 50 MG tablet Take 1 tablet (50 mg total) by mouth every 6 (six) hours as needed. 15 tablet 0   No current facility-administered medications for this visit.     PHYSICAL EXAMINATION: ECOG PERFORMANCE STATUS: 1 - Symptomatic but completely ambulatory  Vitals:   04/17/17 1545  BP: (!) 147/91  Pulse: 88  Resp: 18  Temp: 98.5 F (36.9 C)  SpO2: 100%   Filed Weights   04/17/17 1545  Weight: 128 lb 1.6 oz (58.1 kg)    GENERAL:alert, no distress and comfortable SKIN: skin color, texture, turgor are normal, no rashes or significant lesions EYES: normal, Conjunctiva are pink and non-injected, sclera clear OROPHARYNX:no exudate, no erythema and lips, buccal mucosa, and tongue normal  NECK: supple, thyroid normal size, non-tender, without nodularity LYMPH:  no palpable lymphadenopathy in the cervical, axillary or inguinal LUNGS: clear to auscultation and percussion with normal breathing effort HEART: regular rate & rhythm and no murmurs and no lower extremity edema ABDOMEN:abdomen soft, non-tender and normal bowel sounds MUSCULOSKELETAL:no cyanosis of digits and no clubbing  NEURO: alert & oriented x 3 with fluent speech, no focal motor/sensory deficits EXTREMITIES: No lower extremity edema   LABORATORY DATA:  I have reviewed the data as listed CMP Latest Ref Rng & Units 09/15/2016 04/26/2011 09/01/2009  Glucose 65 - 99 mg/dL 93 100(H) 78  BUN 6 - 20 mg/dL 10 8 8   Creatinine 0.44 - 1.00 mg/dL 0.78 0.75 0.73  Sodium 135 - 145 mmol/L 138 136 134(L)  Potassium 3.5 - 5.1  mmol/L 3.8 3.4(L) 3.8  Chloride 101 - 111 mmol/L 107 104 105  CO2 22 - 32 mmol/L 24 26 23   Calcium 8.9 - 10.3 mg/dL 9.8 9.7 8.8  Total Protein 6.0 - 8.3 g/dL - 7.4 -  Total Bilirubin 0.3 - 1.2 mg/dL - 0.3 -  Alkaline Phos 39 - 117 U/L - 46 -  AST 0 - 37 U/L - 15 -  ALT 0 - 35 U/L - 12 -    Lab Results   Component Value Date   WBC 3.8 (L) 04/17/2017   HGB 10.9 (L) 10/16/2016   HCT 29.4 (L) 04/17/2017   MCV 72.1 (L) 04/17/2017   PLT 279 04/17/2017   NEUTROABS 2.0 04/17/2017    ASSESSMENT & PLAN:  Iron deficiency anemia due to chronic blood loss Blood work performed 2 weeks ago revealed a hemoglobin of 8.9 and MCV of 72.6. RDW 17.9. Patient has profound uterine bleeding probably related to fibroids. She has periods every 2-1/2 weeks that are heavy. She is intolerant to oral iron therapy.  Received IV iron August 2019 Labs review: Hemoglobin is 8.8 MCV 72.1 I discussed with the patient that she would benefit from systemic iron therapy. She will come next week to receive IV iron.  2 doses 1 week apart. Return to clinic in 5 months with recheck of her CBC and iron studies.  I spent 25 minutes talking to the patient of which more than half was spent in counseling and coordination of care.  No orders of the defined types were placed in this encounter.  The patient has a good understanding of the overall plan. she agrees with it. she will call with any problems that may develop before the next visit here.   Harriette Ohara, MD 04/17/17

## 2017-04-17 NOTE — Assessment & Plan Note (Signed)
Blood work performed 2 weeks ago revealed a hemoglobin of 8.9 and MCV of 72.6. RDW 17.9. Patient has profound uterine bleeding probably related to fibroids. She has periods every 2-1/2 weeks that are heavy. She is intolerant to oral iron therapy.  Received IV iron August 2019 Labs review:

## 2017-04-18 ENCOUNTER — Telehealth: Payer: Self-pay | Admitting: Hematology and Oncology

## 2017-04-18 LAB — FERRITIN

## 2017-04-18 LAB — IRON AND TIBC
IRON: 23 ug/dL — AB (ref 41–142)
SATURATION RATIOS: 6 % — AB (ref 21–57)
TIBC: 395 ug/dL (ref 236–444)
UIBC: 372 ug/dL

## 2017-04-18 NOTE — Telephone Encounter (Signed)
Spoke to patient regarding upcoming march and July appointments.

## 2017-04-23 ENCOUNTER — Encounter (HOSPITAL_COMMUNITY): Payer: 59

## 2017-04-25 ENCOUNTER — Ambulatory Visit (HOSPITAL_COMMUNITY)
Admission: RE | Admit: 2017-04-25 | Discharge: 2017-04-25 | Disposition: A | Discharge status: Home / Self Care | Payer: 59 | Source: Ambulatory Visit | Attending: Hematology and Oncology | Admitting: Hematology and Oncology

## 2017-04-25 DIAGNOSIS — R58 Hemorrhage, not elsewhere classified: Secondary | ICD-10-CM | POA: Insufficient documentation

## 2017-04-25 DIAGNOSIS — D508 Other iron deficiency anemias: Secondary | ICD-10-CM | POA: Insufficient documentation

## 2017-04-25 MED ORDER — SODIUM CHLORIDE 0.9 % IV SOLN
INTRAVENOUS | Status: DC
  Administered 2017-04-25: 10:00:00 via INTRAVENOUS

## 2017-04-25 MED ORDER — FERUMOXYTOL INJECTION 510 MG/17 ML
510.0000 mg | Freq: Once | INTRAVENOUS | Status: AC
  Administered 2017-04-25: 510 mg via INTRAVENOUS
  Filled 2017-04-25: qty 17

## 2017-04-25 NOTE — Progress Notes (Signed)
Patient: Jo [name]  Doctor: Nicholas Lose, MD  Treatment: Feraheme infusion  Patient received Feraheme infusion at [name] today. Tolerated well with no adverse reactions. Discharged in stable ambulatory condition.  Coolidge Breeze, RN 04/25/2017

## 2017-05-09 ENCOUNTER — Inpatient Hospital Stay: Payer: 59 | Attending: Hematology and Oncology

## 2017-05-09 VITALS — BP 118/76 | HR 85 | Temp 98.6°F | Resp 17

## 2017-05-09 DIAGNOSIS — Z79899 Other long term (current) drug therapy: Secondary | ICD-10-CM | POA: Diagnosis not present

## 2017-05-09 DIAGNOSIS — D500 Iron deficiency anemia secondary to blood loss (chronic): Secondary | ICD-10-CM | POA: Diagnosis present

## 2017-05-09 MED ORDER — SODIUM CHLORIDE 0.9 % IV SOLN
Freq: Once | INTRAVENOUS | Status: AC
  Administered 2017-05-09: 15:00:00 via INTRAVENOUS

## 2017-05-09 MED ORDER — FERUMOXYTOL INJECTION 510 MG/17 ML
510.0000 mg | Freq: Once | INTRAVENOUS | Status: AC
  Administered 2017-05-09: 510 mg via INTRAVENOUS
  Filled 2017-05-09: qty 17

## 2017-05-09 NOTE — Patient Instructions (Signed)

## 2017-06-05 DIAGNOSIS — C089 Malignant neoplasm of major salivary gland, unspecified: Secondary | ICD-10-CM | POA: Diagnosis present

## 2017-06-05 DIAGNOSIS — Z124 Encounter for screening for malignant neoplasm of cervix: Secondary | ICD-10-CM | POA: Diagnosis not present

## 2017-06-05 DIAGNOSIS — Z01419 Encounter for gynecological examination (general) (routine) without abnormal findings: Secondary | ICD-10-CM | POA: Diagnosis not present

## 2017-06-09 DIAGNOSIS — Z85818 Personal history of malignant neoplasm of other sites of lip, oral cavity, and pharynx: Secondary | ICD-10-CM | POA: Diagnosis not present

## 2017-06-09 DIAGNOSIS — C801 Malignant (primary) neoplasm, unspecified: Secondary | ICD-10-CM | POA: Diagnosis not present

## 2017-06-09 DIAGNOSIS — Z08 Encounter for follow-up examination after completed treatment for malignant neoplasm: Secondary | ICD-10-CM | POA: Diagnosis not present

## 2017-06-09 DIAGNOSIS — E611 Iron deficiency: Secondary | ICD-10-CM | POA: Diagnosis not present

## 2017-08-11 DIAGNOSIS — C801 Malignant (primary) neoplasm, unspecified: Secondary | ICD-10-CM | POA: Diagnosis not present

## 2017-08-11 DIAGNOSIS — J039 Acute tonsillitis, unspecified: Secondary | ICD-10-CM | POA: Diagnosis not present

## 2017-08-11 DIAGNOSIS — R42 Dizziness and giddiness: Secondary | ICD-10-CM | POA: Diagnosis not present

## 2017-08-20 DIAGNOSIS — R6889 Other general symptoms and signs: Secondary | ICD-10-CM | POA: Diagnosis not present

## 2017-08-20 DIAGNOSIS — D500 Iron deficiency anemia secondary to blood loss (chronic): Secondary | ICD-10-CM | POA: Diagnosis not present

## 2017-08-20 DIAGNOSIS — R569 Unspecified convulsions: Secondary | ICD-10-CM | POA: Diagnosis not present

## 2017-08-21 DIAGNOSIS — R42 Dizziness and giddiness: Secondary | ICD-10-CM | POA: Diagnosis not present

## 2017-08-21 DIAGNOSIS — C801 Malignant (primary) neoplasm, unspecified: Secondary | ICD-10-CM | POA: Diagnosis not present

## 2017-08-21 DIAGNOSIS — D649 Anemia, unspecified: Secondary | ICD-10-CM | POA: Diagnosis not present

## 2017-09-03 DIAGNOSIS — C801 Malignant (primary) neoplasm, unspecified: Secondary | ICD-10-CM | POA: Diagnosis not present

## 2017-09-08 ENCOUNTER — Other Ambulatory Visit: Payer: Self-pay

## 2017-09-08 DIAGNOSIS — D500 Iron deficiency anemia secondary to blood loss (chronic): Secondary | ICD-10-CM

## 2017-09-09 ENCOUNTER — Inpatient Hospital Stay: Payer: 59 | Attending: Hematology and Oncology

## 2017-09-09 ENCOUNTER — Inpatient Hospital Stay: Payer: 59 | Admitting: Hematology and Oncology

## 2017-09-09 NOTE — Assessment & Plan Note (Deleted)
Blood work performed 2 weeks ago revealed a hemoglobin of 8.9 and MCV of 72.6. RDW 17.9. Patient has profound uterine bleeding probably related to fibroids. She has periods every 2-1/2 weeks that are heavy. She is intolerant to oral iron therapy.  Received IV iron August 2018 and February 2019 Lab review 09/09/2017:

## 2017-09-15 DIAGNOSIS — D500 Iron deficiency anemia secondary to blood loss (chronic): Secondary | ICD-10-CM | POA: Diagnosis not present

## 2017-09-19 DIAGNOSIS — D500 Iron deficiency anemia secondary to blood loss (chronic): Secondary | ICD-10-CM | POA: Diagnosis not present

## 2017-09-26 DIAGNOSIS — D500 Iron deficiency anemia secondary to blood loss (chronic): Secondary | ICD-10-CM | POA: Diagnosis not present

## 2017-11-06 DIAGNOSIS — R404 Transient alteration of awareness: Secondary | ICD-10-CM | POA: Diagnosis not present

## 2017-11-06 DIAGNOSIS — R6889 Other general symptoms and signs: Secondary | ICD-10-CM | POA: Diagnosis not present

## 2017-11-06 DIAGNOSIS — G4700 Insomnia, unspecified: Secondary | ICD-10-CM | POA: Diagnosis not present

## 2017-11-06 DIAGNOSIS — G40209 Localization-related (focal) (partial) symptomatic epilepsy and epileptic syndromes with complex partial seizures, not intractable, without status epilepticus: Secondary | ICD-10-CM | POA: Diagnosis present

## 2017-11-06 DIAGNOSIS — R569 Unspecified convulsions: Secondary | ICD-10-CM | POA: Diagnosis not present

## 2017-12-10 DIAGNOSIS — J358 Other chronic diseases of tonsils and adenoids: Secondary | ICD-10-CM | POA: Diagnosis not present

## 2017-12-10 DIAGNOSIS — C089 Malignant neoplasm of major salivary gland, unspecified: Secondary | ICD-10-CM | POA: Diagnosis not present

## 2017-12-10 DIAGNOSIS — C801 Malignant (primary) neoplasm, unspecified: Secondary | ICD-10-CM | POA: Diagnosis not present

## 2017-12-10 DIAGNOSIS — D500 Iron deficiency anemia secondary to blood loss (chronic): Secondary | ICD-10-CM | POA: Diagnosis not present

## 2018-01-06 DIAGNOSIS — R51 Headache: Secondary | ICD-10-CM | POA: Diagnosis not present

## 2018-07-30 ENCOUNTER — Other Ambulatory Visit: Payer: Self-pay | Admitting: Obstetrics and Gynecology

## 2018-07-30 DIAGNOSIS — R928 Other abnormal and inconclusive findings on diagnostic imaging of breast: Secondary | ICD-10-CM

## 2018-08-06 ENCOUNTER — Other Ambulatory Visit: Payer: Self-pay

## 2018-08-06 ENCOUNTER — Ambulatory Visit: Payer: 59

## 2018-08-06 ENCOUNTER — Ambulatory Visit
Admission: RE | Admit: 2018-08-06 | Discharge: 2018-08-06 | Disposition: A | Discharge status: Home / Self Care | Payer: 59 | Source: Ambulatory Visit | Attending: Obstetrics and Gynecology | Admitting: Obstetrics and Gynecology

## 2018-08-06 DIAGNOSIS — R928 Other abnormal and inconclusive findings on diagnostic imaging of breast: Secondary | ICD-10-CM

## 2019-01-20 ENCOUNTER — Other Ambulatory Visit: Payer: Self-pay | Admitting: Obstetrics and Gynecology

## 2019-02-16 ENCOUNTER — Encounter (HOSPITAL_BASED_OUTPATIENT_CLINIC_OR_DEPARTMENT_OTHER): Payer: Self-pay | Admitting: Obstetrics and Gynecology

## 2019-02-16 ENCOUNTER — Other Ambulatory Visit: Payer: Self-pay

## 2019-02-16 NOTE — Progress Notes (Addendum)
Spoke with patient via telephone for pre op interview. NPO after MN. No medications AM of surgery. Patient will need CBC AM of surgery. Arrival time 1015.

## 2019-02-20 ENCOUNTER — Other Ambulatory Visit (HOSPITAL_COMMUNITY)
Admission: RE | Admit: 2019-02-20 | Discharge: 2019-02-20 | Disposition: A | Discharge status: Home / Self Care | Payer: 59 | Source: Ambulatory Visit | Attending: Obstetrics and Gynecology | Admitting: Obstetrics and Gynecology

## 2019-02-20 DIAGNOSIS — Z20828 Contact with and (suspected) exposure to other viral communicable diseases: Secondary | ICD-10-CM | POA: Diagnosis not present

## 2019-02-20 DIAGNOSIS — Z01812 Encounter for preprocedural laboratory examination: Secondary | ICD-10-CM | POA: Insufficient documentation

## 2019-02-20 LAB — SARS CORONAVIRUS 2 (TAT 6-24 HRS): SARS Coronavirus 2: NEGATIVE

## 2019-02-23 ENCOUNTER — Ambulatory Visit (HOSPITAL_BASED_OUTPATIENT_CLINIC_OR_DEPARTMENT_OTHER): Payer: 59 | Admitting: Anesthesiology

## 2019-02-23 ENCOUNTER — Ambulatory Visit (HOSPITAL_BASED_OUTPATIENT_CLINIC_OR_DEPARTMENT_OTHER)
Admission: RE | Admit: 2019-02-23 | Discharge: 2019-02-23 | Disposition: A | Discharge status: Home / Self Care | Payer: 59 | Attending: Obstetrics and Gynecology | Admitting: Obstetrics and Gynecology

## 2019-02-23 ENCOUNTER — Encounter (HOSPITAL_BASED_OUTPATIENT_CLINIC_OR_DEPARTMENT_OTHER): Payer: Self-pay | Admitting: Obstetrics and Gynecology

## 2019-02-23 ENCOUNTER — Encounter (HOSPITAL_BASED_OUTPATIENT_CLINIC_OR_DEPARTMENT_OTHER)
Admission: RE | Disposition: A | Discharge status: Home / Self Care | Payer: Self-pay | Source: Home / Self Care | Attending: Obstetrics and Gynecology

## 2019-02-23 DIAGNOSIS — N920 Excessive and frequent menstruation with regular cycle: Secondary | ICD-10-CM | POA: Diagnosis not present

## 2019-02-23 DIAGNOSIS — N840 Polyp of corpus uteri: Secondary | ICD-10-CM | POA: Diagnosis not present

## 2019-02-23 DIAGNOSIS — D649 Anemia, unspecified: Secondary | ICD-10-CM | POA: Insufficient documentation

## 2019-02-23 HISTORY — DX: Anemia, unspecified: D64.9

## 2019-02-23 HISTORY — PX: CERVICAL POLYPECTOMY: SHX88

## 2019-02-23 HISTORY — PX: DILITATION & CURRETTAGE/HYSTROSCOPY WITH NOVASURE ABLATION: SHX5568

## 2019-02-23 LAB — CBC
HCT: 39.9 % (ref 36.0–46.0)
Hemoglobin: 12.5 g/dL (ref 12.0–15.0)
MCH: 29.5 pg (ref 26.0–34.0)
MCHC: 31.3 g/dL (ref 30.0–36.0)
MCV: 94.1 fL (ref 80.0–100.0)
Platelets: 224 10*3/uL (ref 150–400)
RBC: 4.24 MIL/uL (ref 3.87–5.11)
RDW: 13 % (ref 11.5–15.5)
WBC: 5.3 10*3/uL (ref 4.0–10.5)
nRBC: 0 % (ref 0.0–0.2)

## 2019-02-23 LAB — POCT PREGNANCY, URINE: Preg Test, Ur: NEGATIVE

## 2019-02-23 SURGERY — DILATATION & CURETTAGE/HYSTEROSCOPY WITH NOVASURE ABLATION
Anesthesia: General

## 2019-02-23 MED ORDER — CEFAZOLIN SODIUM-DEXTROSE 2-4 GM/100ML-% IV SOLN
INTRAVENOUS | Status: AC
  Filled 2019-02-23: qty 100

## 2019-02-23 MED ORDER — MEPERIDINE HCL 25 MG/ML IJ SOLN
INTRAMUSCULAR | Status: AC
  Filled 2019-02-23: qty 1

## 2019-02-23 MED ORDER — ACETAMINOPHEN 500 MG PO TABS
1000.0000 mg | ORAL_TABLET | Freq: Once | ORAL | Status: AC
  Administered 2019-02-23: 1000 mg via ORAL
  Filled 2019-02-23: qty 2

## 2019-02-23 MED ORDER — CEFAZOLIN SODIUM-DEXTROSE 2-4 GM/100ML-% IV SOLN
2.0000 g | INTRAVENOUS | Status: AC
  Administered 2019-02-23: 2 g via INTRAVENOUS
  Filled 2019-02-23: qty 100

## 2019-02-23 MED ORDER — ONDANSETRON HCL 4 MG/2ML IJ SOLN
INTRAMUSCULAR | Status: DC | PRN
  Administered 2019-02-23: 4 mg via INTRAVENOUS

## 2019-02-23 MED ORDER — FENTANYL CITRATE (PF) 100 MCG/2ML IJ SOLN
INTRAMUSCULAR | Status: DC | PRN
  Administered 2019-02-23: 100 ug via INTRAVENOUS

## 2019-02-23 MED ORDER — HYDROMORPHONE HCL 1 MG/ML IJ SOLN
0.2500 mg | INTRAMUSCULAR | Status: DC | PRN
  Administered 2019-02-23 (×4): 0.5 mg via INTRAVENOUS
  Filled 2019-02-23: qty 0.5

## 2019-02-23 MED ORDER — OXYCODONE HCL 5 MG PO TABS
ORAL_TABLET | ORAL | Status: AC
  Filled 2019-02-23: qty 1

## 2019-02-23 MED ORDER — SCOPOLAMINE 1 MG/3DAYS TD PT72
MEDICATED_PATCH | TRANSDERMAL | Status: AC
  Filled 2019-02-23: qty 1

## 2019-02-23 MED ORDER — KETOROLAC TROMETHAMINE 30 MG/ML IJ SOLN
30.0000 mg | Freq: Once | INTRAMUSCULAR | Status: DC | PRN
  Filled 2019-02-23: qty 1

## 2019-02-23 MED ORDER — PROMETHAZINE HCL 25 MG/ML IJ SOLN
6.2500 mg | INTRAMUSCULAR | Status: DC | PRN
  Administered 2019-02-23: 6.25 mg via INTRAVENOUS
  Filled 2019-02-23: qty 1

## 2019-02-23 MED ORDER — MIDAZOLAM HCL 2 MG/2ML IJ SOLN
INTRAMUSCULAR | Status: AC
  Filled 2019-02-23: qty 2

## 2019-02-23 MED ORDER — MEPERIDINE HCL 25 MG/ML IJ SOLN
6.2500 mg | INTRAMUSCULAR | Status: DC | PRN
  Administered 2019-02-23: 13:00:00 6.25 mg via INTRAVENOUS
  Filled 2019-02-23: qty 1

## 2019-02-23 MED ORDER — SODIUM CHLORIDE 0.9 % IR SOLN
Status: DC | PRN
  Administered 2019-02-23: 3000 mL

## 2019-02-23 MED ORDER — FENTANYL CITRATE (PF) 100 MCG/2ML IJ SOLN
INTRAMUSCULAR | Status: AC
  Filled 2019-02-23: qty 2

## 2019-02-23 MED ORDER — KETOROLAC TROMETHAMINE 30 MG/ML IJ SOLN
INTRAMUSCULAR | Status: DC | PRN
  Administered 2019-02-23: 30 mg via INTRAVENOUS

## 2019-02-23 MED ORDER — OXYCODONE HCL 5 MG PO TABS
5.0000 mg | ORAL_TABLET | Freq: Once | ORAL | Status: AC | PRN
  Administered 2019-02-23: 16:00:00 5 mg via ORAL
  Filled 2019-02-23: qty 1

## 2019-02-23 MED ORDER — MIDAZOLAM HCL 2 MG/2ML IJ SOLN
INTRAMUSCULAR | Status: DC | PRN
  Administered 2019-02-23: 2 mg via INTRAVENOUS

## 2019-02-23 MED ORDER — SCOPOLAMINE 1 MG/3DAYS TD PT72
1.0000 | MEDICATED_PATCH | TRANSDERMAL | Status: DC
  Administered 2019-02-23: 1.5 mg via TRANSDERMAL
  Filled 2019-02-23: qty 1

## 2019-02-23 MED ORDER — OXYCODONE HCL 5 MG/5ML PO SOLN
5.0000 mg | Freq: Once | ORAL | Status: AC | PRN
  Filled 2019-02-23: qty 5

## 2019-02-23 MED ORDER — HYDROMORPHONE HCL 1 MG/ML IJ SOLN
INTRAMUSCULAR | Status: AC
  Filled 2019-02-23: qty 1

## 2019-02-23 MED ORDER — DEXAMETHASONE SODIUM PHOSPHATE 10 MG/ML IJ SOLN
INTRAMUSCULAR | Status: AC
  Filled 2019-02-23: qty 1

## 2019-02-23 MED ORDER — LIDOCAINE 2% (20 MG/ML) 5 ML SYRINGE
INTRAMUSCULAR | Status: AC
  Filled 2019-02-23: qty 5

## 2019-02-23 MED ORDER — PROMETHAZINE HCL 25 MG/ML IJ SOLN
INTRAMUSCULAR | Status: AC
  Filled 2019-02-23: qty 1

## 2019-02-23 MED ORDER — ACETAMINOPHEN 500 MG PO TABS
ORAL_TABLET | ORAL | Status: AC
  Filled 2019-02-23: qty 2

## 2019-02-23 MED ORDER — LIDOCAINE 2% (20 MG/ML) 5 ML SYRINGE
INTRAMUSCULAR | Status: DC | PRN
  Administered 2019-02-23: 20 mg via INTRAVENOUS

## 2019-02-23 MED ORDER — LACTATED RINGERS IV SOLN
INTRAVENOUS | Status: DC
  Filled 2019-02-23 (×2): qty 1000

## 2019-02-23 MED ORDER — PROPOFOL 10 MG/ML IV BOLUS
INTRAVENOUS | Status: DC | PRN
  Administered 2019-02-23: 200 mg via INTRAVENOUS

## 2019-02-23 MED ORDER — ONDANSETRON HCL 4 MG/2ML IJ SOLN
INTRAMUSCULAR | Status: AC
  Filled 2019-02-23: qty 2

## 2019-02-23 MED ORDER — DEXAMETHASONE SODIUM PHOSPHATE 10 MG/ML IJ SOLN
INTRAMUSCULAR | Status: DC | PRN
  Administered 2019-02-23: 5 mg via INTRAVENOUS

## 2019-02-23 MED ORDER — LIDOCAINE HCL (PF) 1 % IJ SOLN
INTRAMUSCULAR | Status: DC | PRN
  Administered 2019-02-23: 17 mL

## 2019-02-23 SURGICAL SUPPLY — 18 items
ABLATOR SURESOUND NOVASURE (ABLATOR) ×1 IMPLANT
BIPOLAR CUTTING LOOP 21FR (ELECTRODE)
CANISTER SUCT 3000ML PPV (MISCELLANEOUS) ×2 IMPLANT
CATH ROBINSON RED A/P 16FR (CATHETERS) ×1 IMPLANT
COVER WAND RF STERILE (DRAPES) ×2 IMPLANT
DILATOR CANAL MILEX (MISCELLANEOUS) IMPLANT
GAUZE VASELINE 1X8 (GAUZE/BANDAGES/DRESSINGS) IMPLANT
GLOVE ECLIPSE 7.0 STRL STRAW (GLOVE) ×2 IMPLANT
GOWN STRL REUS W/ TWL XL LVL3 (GOWN DISPOSABLE) ×1 IMPLANT
GOWN STRL REUS W/TWL XL LVL3 (GOWN DISPOSABLE) ×2
KIT PROCEDURE FLUENT (KITS) ×2 IMPLANT
KIT TURNOVER CYSTO (KITS) ×2 IMPLANT
LOOP CUTTING BIPOLAR 21FR (ELECTRODE) IMPLANT
PACK VAGINAL MINOR WOMEN LF (CUSTOM PROCEDURE TRAY) ×2 IMPLANT
PAD OB MATERNITY 4.3X12.25 (PERSONAL CARE ITEMS) ×2 IMPLANT
PAD PREP 24X48 CUFFED NSTRL (MISCELLANEOUS) ×2 IMPLANT
TOWEL OR 17X26 10 PK STRL BLUE (TOWEL DISPOSABLE) ×2 IMPLANT
WATER STERILE IRR 500ML POUR (IV SOLUTION) ×2 IMPLANT

## 2019-02-23 NOTE — Discharge Instructions (Signed)
  Post Anesthesia Home Care Instructions  Activity: Get plenty of rest for the remainder of the day. A responsible adult should stay with you for 24 hours following the procedure.  For the next 24 hours, DO NOT: -Drive a car -Paediatric nurse -Drink alcoholic beverages -Take any medication unless instructed by your physician -Make any legal decisions or sign important papers.  Meals: Start with liquid foods such as gelatin or soup. Progress to regular foods as tolerated. Avoid greasy, spicy, heavy foods. If nausea and/or vomiting occur, drink only clear liquids until the nausea and/or vomiting subsides. Call your physician if vomiting continues.  Special Instructions/Symptoms: Your throat may feel dry or sore from the anesthesia or the breathing tube placed in your throat during surgery. If this causes discomfort, gargle with warm salt water. The discomfort should disappear within 24 hours.  If you had a scopolamine patch placed behind your ear for the management of post- operative nausea and/or vomiting:  1. The medication in the patch is effective for 72 hours, after which it should be removed.  Wrap patch in a tissue and discard in the trash. Wash hands thoroughly with soap and water. 2. You may remove the patch earlier than 72 hours if you experience unpleasant side effects which may include dry mouth, dizziness or visual disturbances. 3. Avoid touching the patch. Wash your hands with soap and water after contact with the patch.   DISCHARGE INSTRUCTIONS: HYSTEROSCOPY / ENDOMETRIAL ABLATION The following instructions have been prepared to help you care for yourself upon your return home.  May Remove Scop patch on or before  May take Ibuprofen after  May take stool softner while taking narcotic pain medication to prevent constipation.  Drink plenty of water.  Personal hygiene: Marland Kitchen Use sanitary pads for vaginal drainage, not tampons. . Shower the day after your procedure. . NO tub  baths, pools or Jacuzzis for 2-3 weeks. . Wipe front to back after using the bathroom.  Activity and limitations: . Do NOT drive or operate any equipment for 24 hours. The effects of anesthesia are still present and drowsiness may result. . Do NOT rest in bed all day. . Walking is encouraged. . Walk up and down stairs slowly. . You may resume your normal activity in one to two days or as indicated by your physician. Sexual activity: NO intercourse for at least 2 weeks after the procedure, or as indicated by your Doctor.  Diet: Eat a light meal as desired this evening. You may resume your usual diet tomorrow.  Return to Work: You may resume your work activities in one to two days or as indicated by Marine scientist.  What to expect after your surgery: Expect to have vaginal bleeding/discharge for 2-3 days and spotting for up to 10 days. It is not unusual to have soreness for up to 1-2 weeks. You may have a slight burning sensation when you urinate for the first day. Mild cramps may continue for a couple of days. You may have a regular period in 2-6 weeks.  Call your doctor for any of the following: . Excessive vaginal bleeding or clotting, saturating and changing one pad every hour. . Inability to urinate 6 hours after discharge from hospital. . Pain not relieved by pain medication. . Fever of 100.4 F or greater. . Unusual vaginal discharge or odor.  Return to office _________________Call for an appointment ___________________ Patient's signature: ______________________ Nurse's signature ________________________  [facility] [phone]

## 2019-02-23 NOTE — Anesthesia Preprocedure Evaluation (Addendum)
Anesthesia Evaluation  Patient identified by MRN, date of birth, ID band Patient awake    Reviewed: Allergy & Precautions, H&P , Patient's Chart, lab work & pertinent test results  Airway Mallampati: II  TM Distance: >3 FB Neck ROM: full    Dental no notable dental hx. (+) Dental Advisory Given, Teeth Intact   Pulmonary neg pulmonary ROS,    Pulmonary exam normal breath sounds clear to auscultation       Cardiovascular negative cardio ROS   Rhythm:regular Rate:Normal     Neuro/Psych Seizures -, Well Controlled,  Seizures usually well controlled per patient, however last one about 1.5weeks ago (prior to that, had been years)- absence seizures w/ blank staring, some tongue biting and post-ictal state. Did bite tongue with last seizure but has healed well. No prescribed rescue medication for seizures; typically terminate on their own.  Took antiepileptic last night as scheduled negative psych ROS   GI/Hepatic negative GI ROS, Neg liver ROS,   Endo/Other  negative endocrine ROS  Renal/GU negative Renal ROS   Uterine fibroids, dysfunctional uterine bleeding, polyps    Musculoskeletal negative musculoskeletal ROS (+)   Abdominal Normal abdominal exam  (+)   Peds  Hematology  (+) anemia , hx iron deficiency anemia   Anesthesia Other Findings   Reproductive/Obstetrics negative OB ROS                            Anesthesia Physical  Anesthesia Plan  ASA: II  Anesthesia Plan: General   Post-op Pain Management:    Induction: Intravenous  PONV Risk Score and Plan: 4 or greater and Ondansetron, Dexamethasone, Midazolam, Scopolamine patch - Pre-op and Treatment may vary due to age or medical condition  Airway Management Planned: LMA  Additional Equipment: None  Intra-op Plan:   Post-operative Plan: Extubation in OR  Informed Consent: I have reviewed the patients History and Physical,  chart, labs and discussed the procedure including the risks, benefits and alternatives for the proposed anesthesia with the patient or authorized representative who has indicated his/her understanding and acceptance.     Dental advisory given  Plan Discussed with: CRNA  Anesthesia Plan Comments:         Anesthesia Quick Evaluation

## 2019-02-23 NOTE — Transfer of Care (Signed)
Immediate Anesthesia Transfer of Care Note  Patient: Jo [name]  Procedure(s) Performed: Procedure(s) (LRB): DILATATION & CURETTAGE/HYSTEROSCOPY WITH NOVASURE ABLATION (N/A) CERVICAL POLYPECTOMY (N/A)  Patient Location: PACU  Anesthesia Type: General  Level of Consciousness: awake, oriented, sedated and patient cooperative  Airway & Oxygen Therapy: Patient Spontanous Breathing and Patient connected to face mask oxygen  Post-op Assessment: Report given to PACU RN and Post -op Vital signs reviewed and stable  Post vital signs: Reviewed and stable  Complications: No apparent anesthesia complications  Last Vitals:  Vitals Value Taken Time  BP 160/109 02/23/19 1345  Temp 36.8 C 02/23/19 1316  Pulse 74 02/23/19 1347  Resp 18 02/23/19 1347  SpO2 100 % 02/23/19 1347  Vitals shown include unvalidated device data.  Last Pain:  Vitals:   02/23/19 1040  TempSrc: Oral  PainSc: 5       Patients Stated Pain Goal: 6 (02/23/19 1040)

## 2019-02-23 NOTE — H&P (Signed)
Jo [name] is an 43 y.o. [phone] black  Female who presents to the OR for a hysteroscopy/D&C/Ablation secondary to menorrhagia. On u/s she has a small fibroid which is not felt to be submucosal and a possible polyp. She has had a hysteroscopy with polyp removal in the past.   Past Medical History:  Diagnosis Date  . Anemia   . Fibroid   . Pregnancy induced hypertension   . Seizures ([place])     Past Surgical History:  Procedure Laterality Date  . CESAREAN SECTION    . CESAREAN SECTION  01/04/2012   Procedure: CESAREAN SECTION;  Surgeon: Sharene Butters, MD;  Location: [facility];  Service: Obstetrics;  Laterality: N/A;  Repeat cesarean section with delivery of baby boy at 29. Apgars 9/9.  . TONSILLECTOMY      Family History  Problem Relation Age of Onset  . Diabetes Father   . Heart disease Father   . Hypertension Father   . Diabetes Maternal Aunt   . Hypertension Maternal Aunt   . Hypertension Paternal Uncle   . Cancer Maternal Grandmother        Colon  . Diabetes Maternal Grandmother   . Hypertension Paternal Grandmother   . Heart disease Paternal Grandmother    Social History:  reports that she has never smoked. She has never used smokeless tobacco. She reports that she does not drink alcohol or use drugs.  Allergies: No Known Allergies  Medications Prior to Admission  Medication Sig Dispense Refill  . ferrous sulfate 324 MG TBEC Take 324 mg by mouth daily with breakfast.    . zonisamide (ZONEGRAN) 100 MG capsule Take 100 mg by mouth daily.         Blood pressure (!) 130/95, pulse 84, temperature 98 F (36.7 C), temperature source Oral, resp. rate 16, height 5\' 1"  (1.549 m), weight 55.7 kg, last menstrual period 02/15/2019, SpO2 100 %, unknown if currently breastfeeding. General appearance: alert and cooperative Abdomen: soft non tender no masses   Lab Results  Component Value Date   WBC 5.3 02/23/2019   HGB 12.5 02/23/2019   HCT 39.9 02/23/2019   MCV  94.1 02/23/2019   PLT 224 02/23/2019   Lab Results  Component Value Date   PREGTESTUR NEGATIVE 02/23/2019      Patient Active Problem List   Diagnosis Date Noted  . Iron deficiency anemia due to chronic blood loss 10/03/2016     Imp/ menorrhagia, possible polyp Plan/ Proceed with hysteroscopy/D&C/Ablation  , E 02/23/2019, 11:26 AM

## 2019-02-23 NOTE — Anesthesia Procedure Notes (Addendum)
Procedure Name: LMA Insertion Date/Time: 02/23/2019 12:22 PM Performed by: Suan Halter, CRNA Pre-anesthesia Checklist: Patient identified, Emergency Drugs available, Suction available and Patient being monitored Patient Re-evaluated:Patient Re-evaluated prior to induction Oxygen Delivery Method: Circle system utilized Preoxygenation: Pre-oxygenation with 100% oxygen Induction Type: IV induction Ventilation: Mask ventilation without difficulty LMA: LMA inserted LMA Size: 4.0 Number of attempts: 1 Airway Equipment and Method: Bite block Placement Confirmation: positive ETCO2 and breath sounds checked- equal and bilateral Tube secured with: Tape Dental Injury: Teeth and Oropharynx as per pre-operative assessment

## 2019-02-23 NOTE — Anesthesia Postprocedure Evaluation (Signed)
Anesthesia Post Note  Patient: Jo [name]  Procedure(s) Performed: DILATATION & CURETTAGE/HYSTEROSCOPY WITH NOVASURE ABLATION (N/A ) CERVICAL POLYPECTOMY (N/A )     Patient location during evaluation: PACU Anesthesia Type: General Level of consciousness: awake and alert, oriented and patient cooperative Pain management: pain level controlled Vital Signs Assessment: post-procedure vital signs reviewed and stable Respiratory status: spontaneous breathing, nonlabored ventilation and respiratory function stable Cardiovascular status: blood pressure returned to baseline and stable Postop Assessment: no apparent nausea or vomiting Anesthetic complications: no    Last Vitals:  Vitals:   02/23/19 1345 02/23/19 1400  BP:  (!) 160/97  Pulse: 68 63  Resp: 18 16  Temp:    SpO2: 100% 100%    Last Pain:  Vitals:   02/23/19 1040  TempSrc: Oral  PainSc: [place]

## 2019-02-24 LAB — SURGICAL PATHOLOGY

## 2019-02-24 NOTE — Op Note (Signed)
[name], Jo [name] MEDICAL RECORD [id number] ACCOUNT [id number] DATE OF BIRTH:1975/09/25 FACILITY: [facility] LOCATION: [facility] PHYSICIAN: Kathline Magic, MD  OPERATIVE REPORT  DATE OF PROCEDURE:  02/23/2019  PREOPERATIVE DIAGNOSES: 1.  Menorrhagia. 2.  Uterine polyps. 3.  Anemia. 4.  History of uterine fibroid.  POSTOPERATIVE DIAGNOSES:   1.  Menorrhagia. 2.  Uterine polyps. 3.  Anemia. 4.  History of uterine fibroid.  PROCEDURE PERFORMED: 1.  Hysteroscopy. 2.  Dilation and curettage. 3.  NovaSure endometrial ablation.  SURGEON:  Freda Munro, MD   ANESTHESIA:  General and local.  ANTIBIOTICS:  Ancef 2 g.  DRAINS:  Red rubber catheter to bladder.  SPECIMENS:  Endometrial curetting sent to pathology.  COMPLICATIONS:  None.  ESTIMATED BLOOD LOSS:  Minimal.  FLUID DEFICIT:  70 mL.  DESCRIPTION OF PROCEDURE:  The patient was taken to the operating room where she was placed in the dorsal supine position.  A general anesthetic was then administered without difficulty.  She was then placed in dorsal lithotomy position.  She was prepped  and draped in the usual fashion for this procedure.  Exam under anesthesia was performed and no changes were noted.  Her bladder was drained with a red rubber catheter.  A sterile speculum was placed in the vagina and 20 mL of 1% lidocaine was used for  a paracervical block.  Single-tooth tenaculum was applied to the anterior cervical lip.  The cervical os was then serially dilated.  The uterus was sounded to 9 cm.  The hysteroscope was passed through the endocervical canal, which appeared to be normal.   On entering the uterine cavity, both ostia were visualized.  She did have endometrial polyps on the lateral left midbody of the uterus.  Sharp curettage was then performed with tissue being sent for pathology.  At this point, the NovaSure device was  opened and placed in the uterine cavity.  The length was 6 cm and the width 4.7  cm.  A seating procedure was performed.  Once this was accomplished, a seal test was performed and passed.  The device was turned on.  The patient tolerated the procedure  well.  The device was removed.  The hysteroscope was placed in the uterine cavity and adequate burn was obtained.  The patient was then awoken and taken to the recovery room in stable condition.  She will be discharged to home with Advil and Percocet if  needed.  She will follow up in the office in 4 weeks.  VN/NUANCE  D:02/23/2019 T:02/23/2019 JOB:[id number]

## 2019-06-15 ENCOUNTER — Emergency Department (HOSPITAL_BASED_OUTPATIENT_CLINIC_OR_DEPARTMENT_OTHER)
Admission: EM | Admit: 2019-06-15 | Discharge: 2019-06-15 | Disposition: A | Discharge status: Home / Self Care | Payer: 59 | Attending: Emergency Medicine | Admitting: Emergency Medicine

## 2019-06-15 ENCOUNTER — Emergency Department (HOSPITAL_BASED_OUTPATIENT_CLINIC_OR_DEPARTMENT_OTHER): Payer: 59

## 2019-06-15 ENCOUNTER — Other Ambulatory Visit: Payer: Self-pay

## 2019-06-15 ENCOUNTER — Encounter (HOSPITAL_BASED_OUTPATIENT_CLINIC_OR_DEPARTMENT_OTHER): Payer: Self-pay

## 2019-06-15 DIAGNOSIS — B379 Candidiasis, unspecified: Secondary | ICD-10-CM

## 2019-06-15 DIAGNOSIS — N760 Acute vaginitis: Secondary | ICD-10-CM | POA: Insufficient documentation

## 2019-06-15 DIAGNOSIS — B373 Candidiasis of vulva and vagina: Secondary | ICD-10-CM | POA: Insufficient documentation

## 2019-06-15 DIAGNOSIS — B9689 Other specified bacterial agents as the cause of diseases classified elsewhere: Secondary | ICD-10-CM

## 2019-06-15 DIAGNOSIS — R1011 Right upper quadrant pain: Secondary | ICD-10-CM

## 2019-06-15 DIAGNOSIS — R0789 Other chest pain: Secondary | ICD-10-CM | POA: Diagnosis not present

## 2019-06-15 DIAGNOSIS — R102 Pelvic and perineal pain: Secondary | ICD-10-CM | POA: Diagnosis present

## 2019-06-15 LAB — COMPREHENSIVE METABOLIC PANEL
ALT: 15 U/L (ref 0–44)
AST: 17 U/L (ref 15–41)
Albumin: 4.3 g/dL (ref 3.5–5.0)
Alkaline Phosphatase: 60 U/L (ref 38–126)
Anion gap: 8 (ref 5–15)
BUN: 14 mg/dL (ref 6–20)
CO2: 24 mmol/L (ref 22–32)
Calcium: 10.8 mg/dL — ABNORMAL HIGH (ref 8.9–10.3)
Chloride: 106 mmol/L (ref 98–111)
Creatinine, Ser: 1.04 mg/dL — ABNORMAL HIGH (ref 0.44–1.00)
GFR calc Af Amer: >60 mL/min (ref >60)
GFR calc non Af Amer: >60 mL/min (ref >60)
Glucose, Bld: 86 mg/dL (ref 70–99)
Potassium: 3.9 mmol/L (ref 3.5–5.1)
Sodium: 138 mmol/L (ref 135–145)
Total Bilirubin: 0.5 mg/dL (ref 0.3–1.2)
Total Protein: 7.5 g/dL (ref 6.5–8.1)

## 2019-06-15 LAB — WET PREP, GENITAL
Sperm: NONE SEEN
Trich, Wet Prep: NONE SEEN

## 2019-06-15 LAB — URINALYSIS, ROUTINE W REFLEX MICROSCOPIC
Bilirubin Urine: NEGATIVE
Glucose, UA: NEGATIVE mg/dL
Hgb urine dipstick: NEGATIVE
Ketones, ur: NEGATIVE mg/dL
Leukocytes,Ua: NEGATIVE
Nitrite: NEGATIVE
Protein, ur: NEGATIVE mg/dL
Specific Gravity, Urine: 1.02 (ref 1.005–1.030)
pH: 6 (ref 5.0–8.0)

## 2019-06-15 LAB — CBC WITH DIFFERENTIAL/PLATELET
Abs Immature Granulocytes: 0.01 10*3/uL (ref 0.00–0.07)
Basophils Absolute: 0.1 10*3/uL (ref 0.0–0.1)
Basophils Relative: 1 %
Eosinophils Absolute: 0.3 10*3/uL (ref 0.0–0.5)
Eosinophils Relative: 5 %
HCT: 48.1 % — ABNORMAL HIGH (ref 36.0–46.0)
Hemoglobin: 15.4 g/dL — ABNORMAL HIGH (ref 12.0–15.0)
Immature Granulocytes: 0 %
Lymphocytes Relative: 30 %
Lymphs Abs: 1.7 10*3/uL (ref 0.7–4.0)
MCH: 28.9 pg (ref 26.0–34.0)
MCHC: 32 g/dL (ref 30.0–36.0)
MCV: 90.2 fL (ref 80.0–100.0)
Monocytes Absolute: 0.4 10*3/uL (ref 0.1–1.0)
Monocytes Relative: 7 %
Neutro Abs: 3.2 10*3/uL (ref 1.7–7.7)
Neutrophils Relative %: 57 %
Platelets: 207 10*3/uL (ref 150–400)
RBC: 5.33 MIL/uL — ABNORMAL HIGH (ref 3.87–5.11)
RDW: 14.7 % (ref 11.5–15.5)
WBC: 5.6 10*3/uL (ref 4.0–10.5)
nRBC: 0 % (ref 0.0–0.2)

## 2019-06-15 LAB — LIPASE, BLOOD: Lipase: 54 U/L — ABNORMAL HIGH (ref 11–51)

## 2019-06-15 LAB — PREGNANCY, URINE: Preg Test, Ur: NEGATIVE

## 2019-06-15 MED ORDER — FLUCONAZOLE 150 MG PO TABS: 150.0000 mg | ORAL_TABLET | Freq: Once | ORAL | 0 refills | Status: AC

## 2019-06-15 MED ORDER — IOHEXOL 300 MG/ML  SOLN
100.0000 mL | Freq: Once | INTRAMUSCULAR | Status: AC | PRN
  Administered 2019-06-15: 100 mL via INTRAVENOUS

## 2019-06-15 MED ORDER — DICYCLOMINE HCL 20 MG PO TABS: 20.0000 mg | ORAL_TABLET | Freq: Two times a day (BID) | ORAL | 0 refills | Status: DC | PRN

## 2019-06-15 MED ORDER — KETOROLAC TROMETHAMINE 30 MG/ML IJ SOLN
15.0000 mg | Freq: Once | INTRAMUSCULAR | Status: AC
  Administered 2019-06-15: 15 mg via INTRAVENOUS
  Filled 2019-06-15: qty 1

## 2019-06-15 MED ORDER — HYDROCODONE-ACETAMINOPHEN 5-325 MG PO TABS: 1.0000 | ORAL_TABLET | Freq: Four times a day (QID) | ORAL | 0 refills | Status: DC | PRN

## 2019-06-15 NOTE — ED Provider Notes (Signed)
[facility] EMERGENCY DEPARTMENT Provider Note   CSN: [id number] Arrival date & time: 06/15/19  1357     History Chief Complaint  Patient presents with  . Abdominal Pain  . Chest Pain    DONECIA DAKER is a 44 y.o. female presenting for evaluation of abdominal pain and chest pain.  Patient states approximately 1 month ago she was doing sit ups when she felt something pop in her low abdomen/suprapubic region.  Since then, she has had lower abdominal pain.  Over the past several days, this has worsened significantly, and now radiates to bilateral sides and back.  Additionally, when she woke up this morning, she had severe right lower chest/right upper quadrant abdominal pain.  She reports associated nausea without vomiting.  She called her OB/GYN and her PCP, both of whom recommended she come to the ER for evaluation.  She denies fevers, chills, cough, shortness of breath, urinary symptoms, abnormal bowel movements.  Patient had a D&C and ablation in December 2020.  She has a history of fibroids and cervical polyps.  She has had 2 C-sections, 2011 and 2013, and suprapubic pain overlies incision scar.  She has not taken anything for pain including Tylenol or ibuprofen.  Movement and palpation makes the pain worse, nothing makes it better.  Her chest pain is worse when she takes a deep breath in.  She denies vaginal bleeding or vaginal discharge.  She reports a history of seizures for which she takes zonisamide, no other medical problems.  HPI     Past Medical History:  Diagnosis Date  . Anemia   . Fibroid   . Pregnancy induced hypertension   . Seizures [facility])     Patient Active Problem List   Diagnosis Date Noted  . Iron deficiency anemia due to chronic blood loss 10/03/2016    Past Surgical History:  Procedure Laterality Date  . CERVICAL POLYPECTOMY N/A 02/23/2019   Procedure: CERVICAL POLYPECTOMY;  Surgeon: Olga Millers, MD;  Location: [facility];  Service: Gynecology;  Laterality: N/A;  . CESAREAN SECTION    . CESAREAN SECTION  01/04/2012   Procedure: CESAREAN SECTION;  Surgeon: Sharene Butters, MD;  Location: [facility];  Service: Obstetrics;  Laterality: N/A;  Repeat cesarean section with delivery of baby boy at 4. Apgars 9/9.  Marland Kitchen DILITATION & CURRETTAGE/HYSTROSCOPY WITH NOVASURE ABLATION N/A 02/23/2019   Procedure: DILATATION & CURETTAGE/HYSTEROSCOPY WITH NOVASURE ABLATION;  Surgeon: Olga Millers, MD;  Location: [facility];  Service: Gynecology;  Laterality: N/A;  Possible HTA  . TONSILLECTOMY       OB History    Gravida  4   Para  4   Term  3   Preterm  1   AB      Living  4     SAB      TAB      Ectopic      Multiple      Live Births  4           Family History  Problem Relation Age of Onset  . Diabetes Father   . Heart disease Father   . Hypertension Father   . Diabetes Maternal Aunt   . Hypertension Maternal Aunt   . Hypertension Paternal Uncle   . Cancer Maternal Grandmother        Colon  . Diabetes Maternal Grandmother   . Hypertension Paternal Grandmother   . Heart disease Paternal Grandmother  Social History   Tobacco Use  . Smoking status: Never Smoker  . Smokeless tobacco: Never Used  Substance Use Topics  . Alcohol use: No  . Drug use: Never    Home Medications Prior to Admission medications   Medication Sig Start Date End Date Taking? Authorizing Provider  dicyclomine (BENTYL) 20 MG tablet Take 1 tablet (20 mg total) by mouth 2 (two) times daily as needed for spasms. 06/15/19   , , PA-C  fluconazole (DIFLUCAN) 150 MG tablet Take 1 tablet (150 mg total) by mouth once for 1 dose. 06/15/19 06/15/19  , , PA-C  HYDROcodone-acetaminophen (NORCO/VICODIN) 5-325 MG tablet Take 1 tablet by mouth every 6 (six) hours as needed. 06/15/19   , , PA-C  zonisamide (ZONEGRAN) 100 MG capsule Take 100 mg by mouth daily.     [provider]    Allergies    Patient has no known allergies.  Review of Systems   Review of Systems  Cardiovascular: Positive for chest pain (R lower chest/upper abd).  Gastrointestinal: Positive for abdominal pain.  All other systems reviewed and are negative.   Physical Exam Updated Vital Signs BP (!) 137/94 (BP Location: Right Arm)   Pulse 86   Temp 98.3 F (36.8 C) (Oral)   Resp 19   Ht 5\' 1"  (1.549 m)   Wt 57.2 kg   SpO2 99%   BMI 23.81 kg/m   Physical Exam Vitals and nursing note reviewed. Exam conducted with a chaperone present.  Constitutional:      General: She is not in acute distress.    Appearance: She is well-developed.     Comments: Appears uncomfortable due to pain., otherwise nontoxic  HENT:     Head: Normocephalic and atraumatic.  Eyes:     Conjunctiva/sclera: Conjunctivae normal.     Pupils: Pupils are equal, round, and reactive to light.  Cardiovascular:     Rate and Rhythm: Normal rate and regular rhythm.  Pulmonary:     Effort: Pulmonary effort is normal. No respiratory distress.     Breath sounds: Normal breath sounds. No wheezing.  Abdominal:     General: There is no distension.     Palpations: Abdomen is soft. There is no mass.     Tenderness: There is abdominal tenderness. There is no guarding or rebound.     Comments: Tenderness palpation of suprapubic abdomen.  Tenderness palpation of right upper quadrant with a positive Murphy sign.  No rigidity, guarding, distention.  Negative rebound.  No peritonitis.  Genitourinary:    Cervix: Discharge present. No cervical motion tenderness.     Uterus: Tender.      Adnexa: Right adnexa normal and left adnexa normal.     Comments: Minimal discharge noted on exam.  No CMT or adnexal tenderness.  Tenderness palpation of the uterus. Musculoskeletal:        General: Normal range of motion.     Cervical back: Normal range of motion and neck supple.  Skin:    General: Skin is warm and dry.      Capillary Refill: Capillary refill takes less than 2 seconds.  Neurological:     Mental Status: She is alert and oriented to person, place, and time.     ED Results / Procedures / Treatments   Labs (all labs ordered are listed, but only abnormal results are displayed) Labs Reviewed  WET PREP, GENITAL - Abnormal; Notable for the following components:      Result Value  Yeast Wet Prep HPF POC PRESENT (*)    Clue Cells Wet Prep HPF POC PRESENT (*)    WBC, Wet Prep HPF POC MANY (*)    All other components within normal limits  CBC WITH DIFFERENTIAL/PLATELET - Abnormal; Notable for the following components:   RBC 5.33 (*)    Hemoglobin 15.4 (*)    HCT 48.1 (*)    All other components within normal limits  COMPREHENSIVE METABOLIC PANEL - Abnormal; Notable for the following components:   Creatinine, Ser 1.04 (*)    Calcium 10.8 (*)    All other components within normal limits  LIPASE, BLOOD - Abnormal; Notable for the following components:   Lipase 54 (*)    All other components within normal limits  URINALYSIS, ROUTINE W REFLEX MICROSCOPIC - Abnormal; Notable for the following components:   APPearance CLOUDY (*)    All other components within normal limits  PREGNANCY, URINE  GC/CHLAMYDIA PROBE AMP ([place]) NOT AT [facility]    EKG None  Radiology DG Chest 2 View  Result Date: 06/15/2019 CLINICAL DATA:  Right-sided chest pain EXAM: CHEST - 2 VIEW COMPARISON:  09/15/2016 FINDINGS: The heart size and mediastinal contours are within normal limits. Both lungs are clear. The visualized skeletal structures are unremarkable. IMPRESSION: No active cardiopulmonary disease. Electronically Signed   By: Donavan Foil M.D.   On: 06/15/2019 17:00   CT ABDOMEN PELVIS W CONTRAST  Result Date: 06/15/2019 CLINICAL DATA:  Right upper quadrant pain. EXAM: CT ABDOMEN AND PELVIS WITH CONTRAST TECHNIQUE: Multidetector CT imaging of the abdomen and pelvis was performed using the standard protocol  following bolus administration of intravenous contrast. CONTRAST:  12mL OMNIPAQUE IOHEXOL 300 MG/ML  SOLN COMPARISON:  None. FINDINGS: Lower chest: Unremarkable. Hepatobiliary: No suspicious focal abnormality within the liver parenchyma. There is no evidence for gallstones, gallbladder wall thickening, or pericholecystic fluid. No intrahepatic or extrahepatic biliary dilation. Pancreas: No focal mass lesion. No dilatation of the main duct. No intraparenchymal cyst. No peripancreatic edema. Spleen: No splenomegaly. No focal mass lesion. Adrenals/Urinary Tract: No adrenal nodule or mass. 2 mm nonobstructing stone noted lower interpolar right kidney (29/2). 2 cm cyst noted interpolar left kidney with a second 7 mm hypoattenuating left renal lesion too small to characterize but likely benign. No evidence for hydroureter. The urinary bladder appears normal for the degree of distention. Stomach/Bowel: Stomach is unremarkable. No gastric wall thickening. No evidence of outlet obstruction. Duodenum is normally positioned as is the ligament of Treitz. No small bowel wall thickening. No small bowel dilatation. The terminal ileum is normal. The appendix is normal. No gross colonic mass. No colonic wall thickening. Vascular/Lymphatic: No abdominal aortic aneurysm. No abdominal aortic atherosclerotic calcification. There is no gastrohepatic or hepatoduodenal ligament lymphadenopathy. No retroperitoneal or mesenteric lymphadenopathy. No pelvic sidewall lymphadenopathy. Reproductive: Endometrial thickening may be physiologic in this premenopausal female. Uterine fibroids evident. There is no adnexal mass. Other: No substantial intraperitoneal free fluid. Musculoskeletal: No worrisome lytic or sclerotic osseous abnormality. Tiny umbilical hernia contains only fat. No evidence for rectus sheath hematoma. IMPRESSION: 1. No acute findings in the abdomen or pelvis. Specifically, no findings to explain the patient's history of right  upper quadrant pain. 2. 2 mm nonobstructing stone lower interpolar right kidney. 3. Uterine fibroids. Electronically Signed   By: Misty Stanley M.D.   On: 06/15/2019 17:12    Procedures Procedures (including critical care time)  Medications Ordered in ED Medications  ketorolac (TORADOL) 30 MG/ML injection 15 mg (has  no administration in time range)  iohexol (OMNIPAQUE) 300 MG/ML solution 100 mL (100 mLs Intravenous Contrast Given 06/15/19 1630)    ED Course  I have reviewed the triage vital signs and the nursing notes.  Pertinent labs & imaging results that were available during my care of the patient were reviewed by me and considered in my medical decision making (see chart for details).    MDM Rules/Calculators/A&P                      Patient presenting for evaluation of suprapubic and right upper quadrant abdominal pain.  On exam, patient appears uncomfortable, but otherwise nontoxic.  Pelvic exam shows uterine tenderness, but otherwise reassuring.  No significant discharge, and patient is without high risk sexual behavior.  Low suspicion for PID or infection as cause of symptoms.   Regarding patient's right lower chest/upper abdominal pain, will obtain labs and CT imaging.  Consider gallbladder versus gastritis versus pancreatitis versus GERD versus PUD vs pleurisy versus early pneumonia.  Low suspicion for PE.   Labs interpreted by me, overall reassuring.  No leukocytosis.  Electrolytes stable.  Lipase is mildly elevated at 54, however exam is not consistent with pancreatitis.  CT pending.  Urine without signs of infection.  Wet prep shows clue cells and yeast, consistent with BV and yeast infection.  I do not believe this is the cause of her symptoms, however will treat.  Chest x-ray viewed interpreted by me, no pneumonia no thorax and effusion, cardiomegaly.  CT of the abdomen pelvis did not show any acute findings.  Shows thickened endometrial lining, which may or may not be normal  in the setting of an endometrial ablation.  No signs of infection or need for emergent surgery.  I discussed findings with patient.  Discussed symptomatic treatment at home and close follow-up with OB/GYN.  Case discussed with attending, Dr. Rex Kras agrees to plan.  At this time, patient appears safe for discharge.  Return precautions given.  Patient states she understands and agrees to plan.  Final Clinical Impression(s) / ED Diagnoses Final diagnoses:  Suprapubic abdominal pain  Yeast infection  BV (bacterial vaginosis)  RUQ abdominal pain    Rx / DC Orders ED Discharge Orders         Ordered    dicyclomine (BENTYL) 20 MG tablet  2 times daily PRN     06/15/19 1749    HYDROcodone-acetaminophen (NORCO/VICODIN) 5-325 MG tablet  Every 6 hours PRN     06/15/19 1749    fluconazole (DIFLUCAN) 150 MG tablet   Once     06/15/19 1749           Franchot Heidelberg, PA-C 06/15/19 1756    Little, Wenda Overland, MD 06/15/19 1902

## 2019-06-15 NOTE — ED Triage Notes (Signed)
Pt arrives with c/o abdominal pain around her C-Section scar states history of fibroids and cysts. Has had an ablation. In March pt reports she was doing sit ups and felt a pull. Pt reports increased abdominal pain X3 days, reports pain radiates into her back. States she slept with the heating pad last night and this morning the pain radiated into her right chest. Pt called OB GYN and was told to come to ER.

## 2019-06-15 NOTE — ED Notes (Signed)
Pt ambulated to bathroom 

## 2019-06-15 NOTE — ED Notes (Signed)
ED Provider at bedside. 

## 2019-06-15 NOTE — Discharge Instructions (Signed)
Your work-up today was overall reassuring.  There are no signs of infection, tears/ruptures, or things that needed surgery.  Your endometrial lining was found to be thickened, which may or may not be normal in the setting of an ablation.  You should follow-up with your OB/GYN regarding your pain and abnormal CT findings.  Take ibuprofen 3 times a day with meals.  Do not take other anti-inflammatories at the same time (Advil, Motrin, naproxen, Aleve). You may supplement with Tylenol if you need further pain control. Take Bentyl as needed for abdominal pain. You may take Norco as needed for severe breakthrough pain.  Have caution, this may make you tired or groggy.  Do not drive or operate machinery while taking this medicine. Take the Diflucan to treat the yeast infection. Return to the emergency room if you develop fevers, persistent vomiting, difficulty breathing, severe worsening pain, any new, worsening, or concerning symptoms.

## 2019-06-16 LAB — GC/CHLAMYDIA PROBE AMP (~~LOC~~) NOT AT ARMC
Chlamydia: NEGATIVE
Comment: NEGATIVE
Comment: NORMAL
Neisseria Gonorrhea: NEGATIVE

## 2019-08-19 ENCOUNTER — Other Ambulatory Visit: Payer: Self-pay

## 2019-08-19 ENCOUNTER — Ambulatory Visit: Payer: 59 | Admitting: Podiatry

## 2019-08-19 ENCOUNTER — Encounter: Payer: Self-pay | Admitting: Podiatry

## 2019-08-19 ENCOUNTER — Ambulatory Visit (INDEPENDENT_AMBULATORY_CARE_PROVIDER_SITE_OTHER): Payer: 59

## 2019-08-19 DIAGNOSIS — M67471 Ganglion, right ankle and foot: Secondary | ICD-10-CM | POA: Diagnosis not present

## 2019-08-19 DIAGNOSIS — R2241 Localized swelling, mass and lump, right lower limb: Secondary | ICD-10-CM | POA: Diagnosis not present

## 2019-08-19 DIAGNOSIS — M6740 Ganglion, unspecified site: Secondary | ICD-10-CM

## 2019-08-19 NOTE — Progress Notes (Signed)
Subjective:  Patient ID: Jo [name], female    DOB: 07-23-1975,  MRN: [id number]  Chief Complaint  Patient presents with  . Foot Problem    i have a bump on the right foot     44 y.o. female presents with the above complaint.  Notices starting in about May 2021.  She thinks is about the same size.  Is not particularly painful, does not burn does not cause numbness or tingling.  Does not hurt with shoe gear.  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  This was a very traumatic experience in her, both with the cancer diagnosis as well as the diagnostic process that led up to this.  She feels it is very important for her to have a clear diagnosis of what this is. Past Medical History:  Diagnosis Date  . Anemia   . Fibroid   . Pregnancy induced hypertension   . Seizures ([place])    Past Surgical History:  Procedure Laterality Date  . CERVICAL POLYPECTOMY N/A 02/23/2019   Procedure: CERVICAL POLYPECTOMY;  Surgeon: Olga Millers, MD;  Location: [facility];  Service: Gynecology;  Laterality: N/A;  . CESAREAN SECTION    . CESAREAN SECTION  01/04/2012   Procedure: CESAREAN SECTION;  Surgeon: Sharene Butters, MD;  Location: [facility];  Service: Obstetrics;  Laterality: N/A;  Repeat cesarean section with delivery of baby boy at 49. Apgars 9/9.  [place] DILITATION & CURRETTAGE/HYSTROSCOPY WITH NOVASURE ABLATION N/A 02/23/2019   Procedure: DILATATION & CURETTAGE/HYSTEROSCOPY WITH NOVASURE ABLATION;  Surgeon: Olga Millers, MD;  Location: [facility];  Service: Gynecology;  Laterality: N/A;  Possible HTA  . TONSILLECTOMY      Current Outpatient Medications:  .  Ferrous Sulfate (IRON PO), Take by mouth., Disp: , Rfl:  .  IBUPROFEN PO, Take by mouth., Disp: , Rfl:  .  zonisamide (ZONEGRAN) 100 MG capsule, Take 100 mg by mouth daily., Disp: , Rfl:     No Known Allergies Review of Systems: Negative except as noted in the HPI. Denies N/V/F/Ch. Objective:  There were no vitals filed for this visit. General AA&O x3. Normal mood and affect.  Vascular Dorsalis pedis and posterior tibial pulses  present 2+ bilaterally  Capillary refill normal to all digits. Pedal hair growth normal.  Neurologic Epicritic sensation grossly present.  Dermatologic No open lesions. Interspaces clear of maceration. Nails well groomed and normal in appearance.  Orthopedic: MMT 5/5 in dorsiflexion, plantarflexion, inversion, and eversion. Normal joint ROM without pain or crepitus.  There is a soft, mobile, nontender approximately 1 to 2 cm mass just medial to the extensor hallucis longus tendon and lateral to the tibialis anterior tendon overlying the medial cuneiform.  Does not appear to move with range of motion of the tibialis anterior or extensor houses longus tendons, this motion does not cause her pain does not cause pain over the medial cutaneous nerve with palpation.  Radiology: 3 views of the right foot were taken with a skin marker overlying the mass.  The mass is located over the medial cuneiform.  There is not appear to be any notable spurring or [place] of [place] at the first tarsometatarsal joint or the medial naviculocuneiform joint  Assessment & Plan:  Patient was evaluated and treated and all questions answered.  Encounter Diagnoses  Name Primary?  . Ganglion cyst of  right foot Yes  . Subcutaneous mass of right foot    Clinically, she appears to have a likely ganglion cyst of the right foot, it is unclear to me if this is originating from the tibialis anterior tendon sheath, over the extensor hallux longus tendon sheath.  Additionally it could be arising over that medial dorsal cutaneous nerve.  Given the lack of neuritic symptoms I am doubtful of this or that it is a nerve tumor such as a schwannoma.  The mass itself is relatively soft and has  fluctuance about it and feel it appears fluid-filled, so a fibroma is less likely but remains in the differential.  I would like to differentiate this mass for a clear diagnosis for further treatment if indicated, as well as peace of mind given her complicated history of carcinoma.  I will order a MRI of the right foot.  She has an upcoming MRI that has not yet been scheduled from her gynecologist at [facility].  She will obtain the right foot at the same time.  I did educate her that if she does this and the MRI is not done and can help that they should give her a CD when she leaves that I can review the images myself as well as the report to be faxed over.  Return in 4 weeks for follow-up to review MRI  Lanae Crumbly, DPM 08/19/2019    -  Return in about 4 weeks (around 09/16/2019) for review of MRI for R foot.

## 2019-08-23 ENCOUNTER — Other Ambulatory Visit: Payer: Self-pay | Admitting: Podiatry

## 2019-08-23 ENCOUNTER — Telehealth: Payer: Self-pay | Admitting: *Deleted

## 2019-08-23 DIAGNOSIS — M6740 Ganglion, unspecified site: Secondary | ICD-10-CM

## 2019-08-23 DIAGNOSIS — R2241 Localized swelling, mass and lump, right lower limb: Secondary | ICD-10-CM

## 2019-08-23 DIAGNOSIS — M67471 Ganglion, right ankle and foot: Secondary | ICD-10-CM

## 2019-08-23 NOTE — Addendum Note (Signed)
Addended by: Harriett Sine D on: 08/23/2019 03:39 PM   Modules accepted: Orders

## 2019-08-23 NOTE — Telephone Encounter (Signed)
Pt called back returning nurses call. Pt has not scheduled the MRI with her Gynecologist yet but will call us once it is scheduled so that we can coordinate the MRI for her foot as well.

## 2019-08-23 NOTE — Telephone Encounter (Signed)
-----   Message from Criselda Peaches, DPM sent at 08/19/2019  2:11 PM EDT ----- Hi Val, I've ordered an MRI for Ms Rensch's R foot for a ganglion cyst. She has an upcoming MR for her gynecologist at [facility] and was hoping schedule if they can do the same day (she has not scheduled this. I told her to bring a CD with the images for this as well. Thanks!Dr Sherryle Lis

## 2019-08-23 NOTE — Telephone Encounter (Signed)
Unable to leave a message requesting information concerning her gynecologist at [facility], pt's voice mailbox was full.

## 2019-08-23 NOTE — Telephone Encounter (Signed)
I spoke with pt and informed our office would need information from the imaging agency her gynecologist would use, and once pre-certed by our office, they would call to help her schedule our MRI with her gynecologist orders. Pt states understanding, and informed me of [facility] Radiology Scheduling, [phone], fax [phone]

## 2019-08-24 ENCOUNTER — Other Ambulatory Visit: Payer: Self-pay | Admitting: Obstetrics and Gynecology

## 2019-08-24 DIAGNOSIS — R928 Other abnormal and inconclusive findings on diagnostic imaging of breast: Secondary | ICD-10-CM

## 2019-09-07 ENCOUNTER — Ambulatory Visit
Admission: RE | Admit: 2019-09-07 | Discharge: 2019-09-07 | Disposition: A | Discharge status: Home / Self Care | Payer: 59 | Source: Ambulatory Visit | Attending: Obstetrics and Gynecology | Admitting: Obstetrics and Gynecology

## 2019-09-07 ENCOUNTER — Other Ambulatory Visit: Payer: Self-pay

## 2019-09-07 ENCOUNTER — Other Ambulatory Visit: Payer: Self-pay | Admitting: Obstetrics and Gynecology

## 2019-09-07 DIAGNOSIS — R928 Other abnormal and inconclusive findings on diagnostic imaging of breast: Secondary | ICD-10-CM

## 2019-09-10 ENCOUNTER — Ambulatory Visit
Admission: RE | Admit: 2019-09-10 | Discharge: 2019-09-10 | Disposition: A | Discharge status: Home / Self Care | Payer: 59 | Source: Ambulatory Visit | Attending: Obstetrics and Gynecology | Admitting: Obstetrics and Gynecology

## 2019-09-10 ENCOUNTER — Other Ambulatory Visit: Payer: Self-pay

## 2019-09-10 DIAGNOSIS — R928 Other abnormal and inconclusive findings on diagnostic imaging of breast: Secondary | ICD-10-CM

## 2019-09-14 ENCOUNTER — Telehealth: Payer: Self-pay | Admitting: *Deleted

## 2019-09-14 NOTE — Telephone Encounter (Signed)
Called and spoke with Toney Reil from [facility] and the procedure code [phone] doesn't require authorization but the chart note  does need to be sent to the clinical department at [phone] and the reference number is [id number]. Lattie Haw

## 2019-09-16 ENCOUNTER — Ambulatory Visit: Payer: 59 | Admitting: Podiatry

## 2019-09-23 ENCOUNTER — Ambulatory Visit: Payer: 59 | Admitting: Podiatry

## 2020-01-26 ENCOUNTER — Encounter: Payer: Self-pay | Admitting: Podiatry

## 2020-02-22 ENCOUNTER — Other Ambulatory Visit: Payer: Self-pay | Admitting: Obstetrics and Gynecology

## 2020-02-22 DIAGNOSIS — R928 Other abnormal and inconclusive findings on diagnostic imaging of breast: Secondary | ICD-10-CM

## 2020-03-04 ENCOUNTER — Other Ambulatory Visit: Payer: Self-pay

## 2020-03-04 ENCOUNTER — Emergency Department (HOSPITAL_COMMUNITY)
Admission: EM | Admit: 2020-03-04 | Discharge: 2020-03-05 | Disposition: A | Discharge status: Home / Self Care | Payer: 59 | Attending: Emergency Medicine | Admitting: Emergency Medicine

## 2020-03-04 DIAGNOSIS — R000 Tachycardia, unspecified: Secondary | ICD-10-CM | POA: Insufficient documentation

## 2020-03-04 DIAGNOSIS — R569 Unspecified convulsions: Secondary | ICD-10-CM | POA: Insufficient documentation

## 2020-03-04 LAB — CBG MONITORING, ED: Glucose-Capillary: 137 mg/dL — ABNORMAL HIGH (ref 70–99)

## 2020-03-04 NOTE — ED Provider Notes (Signed)
[facility] DEPT Provider Note   CSN: [id number] Arrival date & time: 03/04/20  2331     History Chief Complaint  Patient presents with   Seizures    Hx of same    Jo [name] is a 45 y.o. female.  Patient has reported history of [place] seizures but apparently today had a grand mal seizure.  She has had 1 lives in 2 to 3 years.  Patient apparently was postictal with EMS and very combative.  And get 7.5 mg of IM Versed and thus cannot provide any history at this time.  She did rip out her IV.  Apparently her husband is on his way.  He states that she has not had one in a few years.  Stated no recent illnesses.  Still pending his arrival.   Seizures Seizure activity on arrival: no   Seizure type:  T[place]  On husband's arrival he states that this was little bit different than normal.  She normally has [employer] seizures that lasted a couple minutes.  She had 1 of those that lasted approximately 10 minutes and then turned into a grand mal seizure.  She he states that this is very unlike her.  She was very agitated afterwards (postictal state) and he states that she did hit her head and now she is complaining of a headache.  No external evidence of trauma.     Past Medical History:  Diagnosis Date   Anemia    Fibroid    Pregnancy induced hypertension    Seizures ([place])     Patient Active Problem List   Diagnosis Date Noted   Iron deficiency anemia due to chronic blood loss 10/03/2016    Past Surgical History:  Procedure Laterality Date   CERVICAL POLYPECTOMY N/A 02/23/2019   Procedure: CERVICAL POLYPECTOMY;  Surgeon: Olga Millers, MD;  Location: [facility];  Service: Gynecology;  Laterality: N/A;   CESAREAN SECTION     CESAREAN SECTION  01/04/2012   Procedure: CESAREAN SECTION;  Surgeon: Sharene Butters, MD;  Location: [facility];  Service: Obstetrics;  Laterality: N/A;  Repeat cesarean section with  delivery of baby boy at 14. Apgars 9/9.   DILITATION & CURRETTAGE/HYSTROSCOPY WITH NOVASURE ABLATION N/A 02/23/2019   Procedure: DILATATION & CURETTAGE/HYSTEROSCOPY WITH NOVASURE ABLATION;  Surgeon: Olga Millers, MD;  Location: [facility];  Service: Gynecology;  Laterality: N/A;  Possible HTA   TONSILLECTOMY       OB History    Gravida  4   Para  4   Term  3   Preterm  1   AB      Living  4     SAB      IAB      Ectopic      Multiple      Live Births  4           Family History  Problem Relation Age of Onset   Diabetes Father    Heart disease Father    Hypertension Father    Diabetes Maternal Aunt    Hypertension Maternal Aunt    Hypertension Paternal Uncle    Cancer Maternal Grandmother        Colon   Diabetes Maternal Grandmother    Hypertension Paternal Grandmother    Heart disease Paternal Grandmother     Social History   Tobacco Use   Smoking status: Never Smoker   Smokeless tobacco: Never Used  Vaping Use   Vaping Use: Never used  Substance Use Topics   Alcohol use: No   Drug use: Never    Home Medications Prior to Admission medications   Medication Sig Start Date End Date Taking? Authorizing Provider  diazepam (DIASTAT ACUDIAL) 20 MG GEL Place 20 mg rectally once as needed for up to 1 dose (seizure longer than 10 minutes). 03/05/20  Yes , Corene Cornea, MD  Ferrous Sulfate (IRON PO) Take by mouth.    [provider]  IBUPROFEN PO Take by mouth.    [provider]  zonisamide (ZONEGRAN) 100 MG capsule Take 100 mg by mouth daily.    [provider]    Allergies    Patient has no known allergies.  Review of Systems   Review of Systems  Unable to perform ROS: Mental status change  Neurological: Positive for seizures.    Physical Exam Updated Vital Signs BP 119/85 (BP Location: Left Arm)    Pulse 96    Temp 98 F (36.7 C) (Oral)    Resp 19    SpO2 98%   Physical  Exam Vitals and nursing note reviewed.  Constitutional:      Appearance: She is well-developed and well-nourished.  HENT:     Head: Normocephalic and atraumatic.     Mouth/Throat:     Mouth: Mucous membranes are moist.     Pharynx: Oropharynx is clear.  Eyes:     Pupils: Pupils are equal, round, and reactive to light.  Cardiovascular:     Rate and Rhythm: Normal rate and regular rhythm.  Pulmonary:     Effort: No respiratory distress.     Breath sounds: No stridor.  Abdominal:     General: Abdomen is flat. There is no distension.  Musculoskeletal:        General: No swelling or tenderness. Normal range of motion.     Cervical back: Normal range of motion.  Skin:    General: Skin is warm and dry.  Neurological:     General: No focal deficit present.     Mental Status: She is alert.     ED Results / Procedures / Treatments   Labs (all labs ordered are listed, but only abnormal results are displayed) Labs Reviewed  CBC WITH DIFFERENTIAL/PLATELET - Abnormal; Notable for the following components:      Result Value   WBC 10.8 (*)    RBC 5.16 (*)    Hemoglobin 15.5 (*)    HCT 47.3 (*)    Neutro Abs 9.3 (*)    All other components within normal limits  BASIC METABOLIC PANEL - Abnormal; Notable for the following components:   CO2 18 (*)    Glucose, Bld 105 (*)    Creatinine, Ser 1.02 (*)    All other components within normal limits  CBG MONITORING, ED - Abnormal; Notable for the following components:   Glucose-Capillary 137 (*)    All other components within normal limits  MAGNESIUM  HCG, QUANTITATIVE, PREGNANCY    EKG EKG Interpretation  Date/Time:  Sunday March 05 2020 00:03:24 EST Ventricular Rate:  111 PR Interval:    QRS Duration: 69 QT Interval:  342 QTC Calculation: 465 R Axis:   26 Text Interpretation: Sinus tachycardia Probable left atrial enlargement Borderline T abnormalities, anterior leads No significant change since last tracing Confirmed by  Merrily Pew [phone]) on 03/05/2020 1:24:02 AM   Radiology CT Head Wo Contrast  Result Date: 03/05/2020 CLINICAL DATA:  [employer]  seizure activity, last episode 3 years prior with EXAM: CT HEAD WITHOUT CONTRAST TECHNIQUE: Contiguous axial images were obtained from the base of the skull through the vertex without intravenous contrast. COMPARISON:  None. FINDINGS: Brain: No evidence of acute infarction, hemorrhage, hydrocephalus, extra-axial collection, visible mass lesion or mass effect. Midline intracranial structures are unremarkable. No gross structural abnormalities are evident. Cerebellar tonsils are normally positioned. Vascular: No hyperdense vessel or unexpected calcification. Skull: No calvarial fracture or suspicious osseous lesion. No scalp swelling or hematoma. Sinuses/Orbits: Minimal mural thickening in the ethmoids. Remaining paranasal sinuses and mastoid air cells are predominantly clear. Dysconjugate gaze with otherwise unremarkable appearance of the orbits. Other: None. IMPRESSION: 1. No acute intracranial abnormality. 2. Dysconjugate gaze is nonspecific. Correlate with inspection and patient history. Otherwise unremarkable CT appearance of the orbits. Electronically Signed   By: Lovena Le M.D.   On: 03/05/2020 03:07    Procedures Procedures (including critical care time)  Medications Ordered in ED Medications  acetaminophen (TYLENOL) tablet 1,000 mg (1,000 mg Oral Given 03/05/20 0051)  lactated ringers bolus 1,000 mL (0 mLs Intravenous Stopped 03/05/20 0201)  fentaNYL (SUBLIMAZE) injection 100 mcg (100 mcg Intravenous Given 03/05/20 0403)    ED Course  I have reviewed the triage vital signs and the nursing notes.  Pertinent labs & imaging results that were available during my care of the patient were reviewed by me and considered in my medical decision making (see chart for details).    MDM Rules/Calculators/A&P                          Seems like a breakthrough seizure  situation, will await husband arrival for more information. For now ecg/cbg check (in the field both were normal).  After husband's history we will go ahead and add on labs as this does seem to be an atypical seizure of sorts for her.  He states that she is usually compliant with medications and thinks that she has been compliant recently.  He also states that she did hit her head and is now complaining of a headache so we will get a CT scan as well. Symptomatic treatment otherwise.   Watched until symptoms improved and was near baseline an mostly asymptomatic. D/w husband using diastat and rx for same. Will fu w/ neurology for same.   Final Clinical Impression(s) / ED Diagnoses Final diagnoses:  Seizure ([place])    Rx / DC Orders ED Discharge Orders         Ordered    diazepam (DIASTAT ACUDIAL) 20 MG GEL  Once PRN        03/05/20 0439           , Corene Cornea, MD 03/05/20 8416

## 2020-03-04 NOTE — ED Triage Notes (Signed)
Pt from home BIB [facility] for grand mal seizure activity last of that type was 3 yrs ago. Pt received a total of 7.5 mg Versed in 3- 2.5 mg doses. Currently obtunded and non-responsive to verbal

## 2020-03-05 ENCOUNTER — Emergency Department (HOSPITAL_COMMUNITY): Payer: 59

## 2020-03-05 LAB — BASIC METABOLIC PANEL
Anion gap: 12 (ref 5–15)
BUN: 12 mg/dL (ref 6–20)
CO2: 18 mmol/L — ABNORMAL LOW (ref 22–32)
Calcium: 9.7 mg/dL (ref 8.9–10.3)
Chloride: 106 mmol/L (ref 98–111)
Creatinine, Ser: 1.02 mg/dL — ABNORMAL HIGH (ref 0.44–1.00)
GFR, Estimated: >60 mL/min (ref >60)
Glucose, Bld: 105 mg/dL — ABNORMAL HIGH (ref 70–99)
Potassium: 3.5 mmol/L (ref 3.5–5.1)
Sodium: 136 mmol/L (ref 135–145)

## 2020-03-05 LAB — CBC WITH DIFFERENTIAL/PLATELET
Abs Immature Granulocytes: 0.05 10*3/uL (ref 0.00–0.07)
Basophils Absolute: 0 10*3/uL (ref 0.0–0.1)
Basophils Relative: 0 %
Eosinophils Absolute: 0.1 10*3/uL (ref 0.0–0.5)
Eosinophils Relative: 1 %
HCT: 47.3 % — ABNORMAL HIGH (ref 36.0–46.0)
Hemoglobin: 15.5 g/dL — ABNORMAL HIGH (ref 12.0–15.0)
Immature Granulocytes: 1 %
Lymphocytes Relative: 8 %
Lymphs Abs: 0.8 10*3/uL (ref 0.7–4.0)
MCH: 30 pg (ref 26.0–34.0)
MCHC: 32.8 g/dL (ref 30.0–36.0)
MCV: 91.7 fL (ref 80.0–100.0)
Monocytes Absolute: 0.5 10*3/uL (ref 0.1–1.0)
Monocytes Relative: 5 %
Neutro Abs: 9.3 10*3/uL — ABNORMAL HIGH (ref 1.7–7.7)
Neutrophils Relative %: 85 %
Platelets: 177 10*3/uL (ref 150–400)
RBC: 5.16 MIL/uL — ABNORMAL HIGH (ref 3.87–5.11)
RDW: 12.6 % (ref 11.5–15.5)
WBC: 10.8 10*3/uL — ABNORMAL HIGH (ref 4.0–10.5)
nRBC: 0 % (ref 0.0–0.2)

## 2020-03-05 LAB — HCG, QUANTITATIVE, PREGNANCY: hCG, Beta Chain, Quant, S: <1 m[IU]/mL (ref <5)

## 2020-03-05 LAB — MAGNESIUM: Magnesium: 2.1 mg/dL (ref 1.7–2.4)

## 2020-03-05 MED ORDER — DIAZEPAM 20 MG RE GEL: 20.0000 mg | Freq: Once | RECTAL | 1 refills | Status: DC | PRN

## 2020-03-05 MED ORDER — LACTATED RINGERS IV BOLUS
1000.0000 mL | Freq: Once | INTRAVENOUS | Status: AC
  Administered 2020-03-05: 1000 mL via INTRAVENOUS

## 2020-03-05 MED ORDER — ACETAMINOPHEN 500 MG PO TABS
1000.0000 mg | ORAL_TABLET | Freq: Once | ORAL | Status: AC
  Administered 2020-03-05: 1000 mg via ORAL
  Filled 2020-03-05: qty 2

## 2020-03-05 MED ORDER — FENTANYL CITRATE (PF) 100 MCG/2ML IJ SOLN
100.0000 ug | Freq: Once | INTRAMUSCULAR | Status: AC
  Administered 2020-03-05: 100 ug via INTRAVENOUS
  Filled 2020-03-05: qty 2

## 2020-03-05 NOTE — ED Notes (Signed)
Seizure pads placed on bed rails, suction set up.

## 2020-03-05 NOTE — ED Notes (Signed)
Pt able to ambulate with assistance to restroom. Pt given apple sauce and cranberry juice to encourage eating.

## 2020-04-18 ENCOUNTER — Other Ambulatory Visit: Payer: Self-pay | Admitting: Obstetrics and Gynecology

## 2020-04-18 DIAGNOSIS — N6489 Other specified disorders of breast: Secondary | ICD-10-CM

## 2020-04-18 DIAGNOSIS — R928 Other abnormal and inconclusive findings on diagnostic imaging of breast: Secondary | ICD-10-CM

## 2020-04-21 ENCOUNTER — Ambulatory Visit
Admission: RE | Admit: 2020-04-21 | Discharge: 2020-04-21 | Disposition: A | Discharge status: Home / Self Care | Payer: 59 | Source: Ambulatory Visit | Attending: Obstetrics and Gynecology | Admitting: Obstetrics and Gynecology

## 2020-04-21 ENCOUNTER — Other Ambulatory Visit: Payer: Self-pay

## 2020-04-21 DIAGNOSIS — N6489 Other specified disorders of breast: Secondary | ICD-10-CM

## 2020-04-21 DIAGNOSIS — R928 Other abnormal and inconclusive findings on diagnostic imaging of breast: Secondary | ICD-10-CM

## 2020-04-28 ENCOUNTER — Encounter (HOSPITAL_BASED_OUTPATIENT_CLINIC_OR_DEPARTMENT_OTHER): Payer: Self-pay

## 2020-04-28 ENCOUNTER — Other Ambulatory Visit: Payer: Self-pay

## 2020-04-28 ENCOUNTER — Emergency Department (HOSPITAL_BASED_OUTPATIENT_CLINIC_OR_DEPARTMENT_OTHER): Payer: 59

## 2020-04-28 ENCOUNTER — Other Ambulatory Visit (HOSPITAL_COMMUNITY): Payer: Self-pay | Admitting: Student

## 2020-04-28 ENCOUNTER — Emergency Department (HOSPITAL_BASED_OUTPATIENT_CLINIC_OR_DEPARTMENT_OTHER)
Admission: EM | Admit: 2020-04-28 | Discharge: 2020-04-28 | Disposition: A | Discharge status: Home / Self Care | Payer: 59 | Attending: Emergency Medicine | Admitting: Emergency Medicine

## 2020-04-28 DIAGNOSIS — N202 Calculus of kidney with calculus of ureter: Secondary | ICD-10-CM | POA: Diagnosis not present

## 2020-04-28 DIAGNOSIS — R1031 Right lower quadrant pain: Secondary | ICD-10-CM | POA: Diagnosis present

## 2020-04-28 DIAGNOSIS — N201 Calculus of ureter: Secondary | ICD-10-CM

## 2020-04-28 DIAGNOSIS — N200 Calculus of kidney: Secondary | ICD-10-CM

## 2020-04-28 LAB — CBC WITH DIFFERENTIAL/PLATELET
Abs Immature Granulocytes: 0.02 10*3/uL (ref 0.00–0.07)
Basophils Absolute: 0.1 10*3/uL (ref 0.0–0.1)
Basophils Relative: 1 %
Eosinophils Absolute: 0.2 10*3/uL (ref 0.0–0.5)
Eosinophils Relative: 3 %
HCT: 44.9 % (ref 36.0–46.0)
Hemoglobin: 15 g/dL (ref 12.0–15.0)
Immature Granulocytes: 0 %
Lymphocytes Relative: 29 %
Lymphs Abs: 1.9 10*3/uL (ref 0.7–4.0)
MCH: 30.3 pg (ref 26.0–34.0)
MCHC: 33.4 g/dL (ref 30.0–36.0)
MCV: 90.7 fL (ref 80.0–100.0)
Monocytes Absolute: 0.4 10*3/uL (ref 0.1–1.0)
Monocytes Relative: 6 %
Neutro Abs: 4 10*3/uL (ref 1.7–7.7)
Neutrophils Relative %: 61 %
Platelets: 199 10*3/uL (ref 150–400)
RBC: 4.95 MIL/uL (ref 3.87–5.11)
RDW: 12.6 % (ref 11.5–15.5)
WBC: 6.5 10*3/uL (ref 4.0–10.5)
nRBC: 0 % (ref 0.0–0.2)

## 2020-04-28 LAB — URINALYSIS, ROUTINE W REFLEX MICROSCOPIC
Bilirubin Urine: NEGATIVE
Glucose, UA: NEGATIVE mg/dL
Ketones, ur: NEGATIVE mg/dL
Leukocytes,Ua: NEGATIVE
Nitrite: NEGATIVE
Protein, ur: NEGATIVE mg/dL
Specific Gravity, Urine: 1.025 (ref 1.005–1.030)
pH: 7 (ref 5.0–8.0)

## 2020-04-28 LAB — URINALYSIS, MICROSCOPIC (REFLEX)

## 2020-04-28 LAB — COMPREHENSIVE METABOLIC PANEL
ALT: 18 U/L (ref 0–44)
AST: 19 U/L (ref 15–41)
Albumin: 4.3 g/dL (ref 3.5–5.0)
Alkaline Phosphatase: 53 U/L (ref 38–126)
Anion gap: 10 (ref 5–15)
BUN: 11 mg/dL (ref 6–20)
CO2: 20 mmol/L — ABNORMAL LOW (ref 22–32)
Calcium: 10 mg/dL (ref 8.9–10.3)
Chloride: 108 mmol/L (ref 98–111)
Creatinine, Ser: 1.1 mg/dL — ABNORMAL HIGH (ref 0.44–1.00)
GFR, Estimated: >60 mL/min (ref >60)
Glucose, Bld: 106 mg/dL — ABNORMAL HIGH (ref 70–99)
Potassium: 3.4 mmol/L — ABNORMAL LOW (ref 3.5–5.1)
Sodium: 138 mmol/L (ref 135–145)
Total Bilirubin: 0.5 mg/dL (ref 0.3–1.2)
Total Protein: 7.3 g/dL (ref 6.5–8.1)

## 2020-04-28 LAB — PREGNANCY, URINE: Preg Test, Ur: NEGATIVE

## 2020-04-28 LAB — LIPASE, BLOOD: Lipase: 40 U/L (ref 11–51)

## 2020-04-28 MED ORDER — TAMSULOSIN HCL 0.4 MG PO CAPS: 0.4000 mg | ORAL_CAPSULE | Freq: Every day | ORAL | 0 refills | Status: DC

## 2020-04-28 MED ORDER — KETOROLAC TROMETHAMINE 30 MG/ML IJ SOLN
30.0000 mg | Freq: Once | INTRAMUSCULAR | Status: AC
  Administered 2020-04-28: 30 mg via INTRAVENOUS
  Filled 2020-04-28: qty 1

## 2020-04-28 MED ORDER — ONDANSETRON HCL 4 MG/2ML IJ SOLN
4.0000 mg | Freq: Once | INTRAMUSCULAR | Status: AC
  Administered 2020-04-28: 4 mg via INTRAVENOUS
  Filled 2020-04-28: qty 2

## 2020-04-28 MED ORDER — IOHEXOL 300 MG/ML  SOLN
100.0000 mL | Freq: Once | INTRAMUSCULAR | Status: AC | PRN
  Administered 2020-04-28: 100 mL via INTRAVENOUS

## 2020-04-28 MED ORDER — HYDROMORPHONE HCL 1 MG/ML IJ SOLN
0.5000 mg | Freq: Once | INTRAMUSCULAR | Status: AC
  Administered 2020-04-28: 0.5 mg via INTRAVENOUS
  Filled 2020-04-28: qty 1

## 2020-04-28 MED ORDER — SODIUM CHLORIDE 0.9 % IV BOLUS
1000.0000 mL | Freq: Once | INTRAVENOUS | Status: AC
  Administered 2020-04-28: 1000 mL via INTRAVENOUS

## 2020-04-28 MED ORDER — ONDANSETRON 4 MG PO TBDP
4.0000 mg | ORAL_TABLET | Freq: Three times a day (TID) | ORAL | 0 refills | Status: DC | PRN
Start: 2020-04-28 — End: 2022-09-12

## 2020-04-28 MED ORDER — OXYCODONE-ACETAMINOPHEN 5-325 MG PO TABS: 2.0000 | ORAL_TABLET | ORAL | 0 refills | Status: DC | PRN

## 2020-04-28 MED ORDER — FENTANYL CITRATE (PF) 100 MCG/2ML IJ SOLN
50.0000 ug | Freq: Once | INTRAMUSCULAR | Status: AC
  Administered 2020-04-28: 50 ug via INTRAVENOUS
  Filled 2020-04-28: qty 2

## 2020-04-28 MED FILL — TAMSULOSIN HCL 0.4 MG CAP: 0.4 | 30 days supply | Qty: 30 | Fill #0

## 2020-04-28 MED FILL — OXYCODONE-ACETAMINOPHEN 5-3: 5-325 | 3 days supply | Qty: 15 | Fill #0

## 2020-04-28 MED FILL — ONDANSETRON ODT 4 MG TABLET: 4 | 7 days supply | Qty: 20 | Fill #0

## 2020-04-28 NOTE — ED Provider Notes (Signed)
[facility] EMERGENCY DEPARTMENT Provider Note   CSN: [id number] Arrival date & time: 04/28/20  1002     History Chief Complaint  Patient presents with  . Abdominal Pain    Jo [name] is a 45 y.o. female who presents with sudden onset right lower quadrant pain that started this morning when she was bearing down for bowel movement.  She states she woke up this morning around 6 AM and had mild pain in her right lower quadrant sensation and that she needed to have a bowel movement.  She states she was bearing down when she "felt something burst" in her right lower abdomen and she suddenly had severe pain at nighttime.  She has vomited twice with NBNB emesis secondary to pain since that time.  She denies any history of similar pain in the past, denies history of kidney stones in the past.  Endorses normal bowel movement this morning, denies history of constipation.  Patient is writhing on the bed crying out in pain stimuli initial exam, her husband is at the bedside.  She denies any urinary symptoms.  I personally reviewed this patient's medical records.  She is history of gestational hypertension, uterine fibroid, and history of uterine ablation, though she still possesses all of her reproductive organs, no longer menstruating.  History of seizure disorder with diazepam as needed, on Zonegran daily.   HPI     Past Medical History:  Diagnosis Date  . Anemia   . Fibroid   . Pregnancy induced hypertension   . Seizures [facility])     Patient Active Problem List   Diagnosis Date Noted  . Iron deficiency anemia due to chronic blood loss 10/03/2016    Past Surgical History:  Procedure Laterality Date  . CERVICAL POLYPECTOMY N/A 02/23/2019   Procedure: CERVICAL POLYPECTOMY;  Surgeon: Olga Millers, MD;  Location: [facility];  Service: Gynecology;  Laterality: N/A;  . CESAREAN SECTION    . CESAREAN SECTION  01/04/2012   Procedure: CESAREAN SECTION;   Surgeon: Sharene Butters, MD;  Location: [facility];  Service: Obstetrics;  Laterality: N/A;  Repeat cesarean section with delivery of baby boy at 30. Apgars 9/9.  Marland Kitchen DILITATION & CURRETTAGE/HYSTROSCOPY WITH NOVASURE ABLATION N/A 02/23/2019   Procedure: DILATATION & CURETTAGE/HYSTEROSCOPY WITH NOVASURE ABLATION;  Surgeon: Olga Millers, MD;  Location: [facility];  Service: Gynecology;  Laterality: N/A;  Possible HTA  . TONSILLECTOMY       OB History    Gravida  4   Para  4   Term  3   Preterm  1   AB      Living  4     SAB      IAB      Ectopic      Multiple      Live Births  4           Family History  Problem Relation Age of Onset  . Diabetes Father   . Heart disease Father   . Hypertension Father   . Diabetes Maternal Aunt   . Hypertension Maternal Aunt   . Hypertension Paternal Uncle   . Cancer Maternal Grandmother        Colon  . Diabetes Maternal Grandmother   . Hypertension Paternal Grandmother   . Heart disease Paternal Grandmother     Social History   Tobacco Use  . Smoking status: Never Smoker  . Smokeless tobacco: Never Used  Vaping Use  .  Vaping Use: Never used  Substance Use Topics  . Alcohol use: No  . Drug use: Never    Home Medications Prior to Admission medications   Medication Sig Start Date End Date Taking? Authorizing Provider  ondansetron (ZOFRAN ODT) 4 MG disintegrating tablet Take 1 tablet (4 mg total) by mouth every 8 (eight) hours as needed for nausea or vomiting. 04/28/20  Yes , Gypsy Balsam, PA-C  oxyCODONE-acetaminophen (PERCOCET/ROXICET) 5-325 MG tablet Take 2 tablets by mouth every 4 (four) hours as needed for severe pain. 04/28/20  Yes , Gypsy Balsam, PA-C  tamsulosin (FLOMAX) 0.4 MG CAPS capsule Take 1 capsule (0.4 mg total) by mouth daily. 04/28/20  Yes ,  R, PA-C  diazepam (DIASTAT ACUDIAL) 20 MG GEL Place 20 mg rectally once as needed for up to 1 dose (seizure longer than  10 minutes). 03/05/20   Mesner, Corene Cornea, MD  Ferrous Sulfate (IRON PO) Take by mouth.    [provider]  IBUPROFEN PO Take by mouth.    [provider]  zonisamide (ZONEGRAN) 100 MG capsule Take 100 mg by mouth daily.    [provider]    Allergies    Patient has no known allergies.  Review of Systems   Review of Systems  Constitutional: Negative.   HENT: Negative.   Eyes: Negative.   Respiratory: Negative.   Cardiovascular: Negative.   Gastrointestinal: Positive for abdominal pain, nausea and vomiting. Negative for blood in stool, constipation and diarrhea.  Genitourinary: Negative for decreased urine volume, dysuria, flank pain, hematuria, pelvic pain, urgency, vaginal bleeding, vaginal discharge and vaginal pain.  Musculoskeletal: Negative.   Skin: Negative.   Neurological: Negative.   Hematological: Negative.     Physical Exam Updated Vital Signs BP 117/79 (BP Location: Right Arm)   Pulse 95   Temp 98.3 F (36.8 C) (Oral)   Resp 20   Ht 5' (1.524 m)   Wt 57.6 kg   SpO2 99%   BMI 24.80 kg/m   Physical Exam Vitals and nursing note reviewed.  Constitutional:      Appearance: She is normal weight.  HENT:     Head: Normocephalic and atraumatic.     Mouth/Throat:     Mouth: Mucous membranes are moist.     Pharynx: No oropharyngeal exudate or posterior oropharyngeal erythema.  Eyes:     General: Lids are normal. Vision grossly intact.        Right eye: No discharge.        Left eye: No discharge.     Conjunctiva/sclera: Conjunctivae normal.  Neck:     Trachea: Trachea and phonation normal.  Cardiovascular:     Rate and Rhythm: Normal rate and regular rhythm.     Pulses: Normal pulses.     Heart sounds: Normal heart sounds. No murmur heard.   Pulmonary:     Effort: Pulmonary effort is normal. No respiratory distress.     Breath sounds: Normal breath sounds. No wheezing or rales.  Chest:     Chest wall: No deformity, swelling,  tenderness or crepitus.  Abdominal:     General: Bowel sounds are normal. There is no distension.     Palpations: Abdomen is soft.     Tenderness: There is abdominal tenderness in the right lower quadrant and suprapubic area. There is no right CVA tenderness or left CVA tenderness.  Musculoskeletal:        General: No deformity.     Cervical back: Neck supple. No crepitus. No  pain with movement or spinous process tenderness.  Skin:    General: Skin is warm and dry.  Neurological:     Mental Status: She is alert and oriented to person, place, and time. Mental status is at baseline.     Cranial Nerves: Cranial nerves are intact.     Sensory: Sensation is intact.     Motor: Motor function is intact.  Psychiatric:        Mood and Affect: Mood normal.     ED Results / Procedures / Treatments   Labs (all labs ordered are listed, but only abnormal results are displayed) Labs Reviewed  COMPREHENSIVE METABOLIC PANEL - Abnormal; Notable for the following components:      Result Value   Potassium 3.4 (*)    CO2 20 (*)    Glucose, Bld 106 (*)    Creatinine, Ser 1.10 (*)    All other components within normal limits  URINALYSIS, ROUTINE W REFLEX MICROSCOPIC - Abnormal; Notable for the following components:   Color, Urine STRAW (*)    Hgb urine dipstick LARGE (*)    All other components within normal limits  URINALYSIS, MICROSCOPIC (REFLEX) - Abnormal; Notable for the following components:   Bacteria, UA MANY (*)    All other components within normal limits  CBC WITH DIFFERENTIAL/PLATELET  LIPASE, BLOOD  PREGNANCY, URINE    EKG None  Radiology CT Abdomen Pelvis W Contrast  Result Date: 04/28/2020 CLINICAL DATA:  Generalized abdominal pain for 1 month with more recent right lower quadrant pain EXAM: CT ABDOMEN AND PELVIS WITH CONTRAST TECHNIQUE: Multidetector CT imaging of the abdomen and pelvis was performed using the standard protocol following bolus administration of intravenous  contrast. CONTRAST:  137mL OMNIPAQUE IOHEXOL 300 MG/ML  SOLN COMPARISON:  06/15/2019 FINDINGS: Lower chest:  No contributory findings. Hepatobiliary: No focal liver abnormality.No evidence of biliary obstruction or stone. Pancreas: Unremarkable. Spleen: Unremarkable. Adrenals/Urinary Tract: Negative adrenals. Right hydroureteronephrosis and delayed excretion above a 3 mm stone in the right pelvic ureter. 2 right renal calculi on coronal reformats, measuring up to 3 mm at the lower pole. No left renal calculi are seen. Simple left renal cyst. Unremarkable bladder. Stomach/Bowel:  No obstruction. No appendicitis. Vascular/Lymphatic: No acute vascular abnormality. No mass or adenopathy. Reproductive:Corpus luteum on the right. Intramural right-sided uterine fibroid measuring 27 mm. Other: No ascites or pneumoperitoneum. Musculoskeletal: No acute abnormalities. Lumbosacral facet osteoarthritis IMPRESSION: 1. Obstructing 3 mm stone in the right pelvic ureter. 2. 2 right renal calculi. 3. 2.7 cm intramural uterine fibroid. Electronically Signed   By: Monte Fantasia M.D.   On: 04/28/2020 11:36    Procedures Procedures  Medications Ordered in ED Medications  fentaNYL (SUBLIMAZE) injection 50 mcg (50 mcg Intravenous Given 04/28/20 1047)  ondansetron (ZOFRAN) injection 4 mg (4 mg Intravenous Given 04/28/20 1045)  iohexol (OMNIPAQUE) 300 MG/ML solution 100 mL (100 mLs Intravenous Contrast Given 04/28/20 1102)  HYDROmorphone (DILAUDID) injection 0.5 mg (0.5 mg Intravenous Given 04/28/20 1140)  sodium chloride 0.9 % bolus 1,000 mL (0 mLs Intravenous Stopped 04/28/20 1324)  ketorolac (TORADOL) 30 MG/ML injection 30 mg (30 mg Intravenous Given 04/28/20 1259)  HYDROmorphone (DILAUDID) injection 0.5 mg (0.5 mg Intravenous Given 04/28/20 1407)    ED Course  I have reviewed the triage vital signs and the nursing notes.  Pertinent labs & imaging results that were available during my care of the patient were reviewed by me and  considered in my medical decision making (see chart for details).  MDM Rules/Calculators/A&P                          36 old female presents with concern for sudden onset right lower quadrant abdominal pain since 6:00 this morning, writhing in pain, initial exam.  The differential diagnosis for right sided abdominal pain includes but is not limited to:  Cholelithiasis / choledocholithiasis / cholecystitis / cholangitis, hepatitis (eg. viral, alcoholic, toxic),liver abscess, pancreatitis, liver / pancreatic / biliary tract cancer, ischemic hepatopathy (shock liver), hepatic vein obstruction (Budd-Chiari syndrome), liver cell adenoma, peptic ulcer disease (duodenal), functional or nonulcer dyspepsia, right lower lobe pneumonia, pyelonephritis, urinary calculi,  Fitz-Hugh-Curtis syndrome (with pelvic inflammatory disease), herpes zoster, trauma or musculoskeletal pain, herniated disk, abdominal abscess, intestinal ischemia, physical or sexual abuse, ectopic pregnancy, IUP, Mittelschmerz, ovarian cyst/torsion, threatened/ievitable abortion, PID, endometriosis, molar pregnancy, heterotopic pregnancy, corpus luteum cyst, appendicitis, Nephrolithiasis UTI/renal colic, IBD.   Hypertensive on intake, vitals otherwise normal.  Cardiopulmonary exam is normal, abdominal exam with exquisite right lower quadrant tenderness palpation, exam complicated by patient running around in bed.  Neurovascular exam is normal, analgesia ordered.  CBC unremarkable, CMP with mild hypokalemia 3.4, bump in creatinine to 1.1, UA with large amount of hemoglobin, not concerning for infection.  CT abdomen pelvis revealed right hydroureteronephrosis with 3 mm stone in the right pelvic ureter.  Additionally there are 2 right renal calculi, and a 2.7 cm stable intramural uterine fibroid.  Pain uncontrolled, analgesia reordered.  Pain improved after repeated dosing of pain medication.  Given reassuring vital signs, laboratory tests and  imaging studies with clear etiology of patient's symptoms, no further work-up is warranted mediastinum.  Will discharge with pain medication, Flomax, and as needed Zofran.  Jo [name] voiced understanding of her medical evaluation and treatment plan.  Each of her questions was answered to her expressed satisfaction.  Strict return precautions given.  Patient is stable and appropriate for discharge at this time.  This chart was dictated using voice recognition software, Dragon. Despite the best efforts of this provider to proofread and correct errors, errors may still occur which can change documentation meaning.  Final Clinical Impression(s) / ED Diagnoses Final diagnoses:  Ureterolithiasis  Kidney stone    Rx / DC Orders ED Discharge Orders         Ordered    oxyCODONE-acetaminophen (PERCOCET/ROXICET) 5-325 MG tablet  Every 4 hours PRN        04/28/20 1358    tamsulosin (FLOMAX) 0.4 MG CAPS capsule  Daily        04/28/20 1358    ondansetron (ZOFRAN ODT) 4 MG disintegrating tablet  Every 8 hours PRN        04/28/20 [address], Gypsy Balsam, PA-C 04/28/20 1945    Blanchie Dessert, MD 04/29/20 2151

## 2020-04-28 NOTE — ED Notes (Signed)
Pt continues to rate pain 10/10, pt resting quietly with eyes closed, reports drowsiness but no pain relief.

## 2020-04-28 NOTE — ED Triage Notes (Signed)
Pt arrives with c/o abdominal pain and reports of feeling like something burst when she had a BM today.   Pt in bathroom,at this time

## 2020-04-28 NOTE — Discharge Instructions (Signed)
You were evaluated in the emergency department today for your right-sided abdominal pain.  You were found to have a kidney stone located In your right lower abdomen  You have been prescribed medication to help you pass the stone called Flomax, additionally been prescribed a narcotic pain medication called oxycodone in combination with Tylenol.  Please do not take any additional Tylenol with this medication, you may take ibuprofen or Aleve as needed in addition to it.  Additionally prescribed as needed nausea medication called Zofran.  Hopefully your stone will pass within the next week.  Return to the emergency department if develop any sudden severe worsening of your abdominal pain, pain not fully controlled at home, he developed nausea or vomiting that does not stop, fevers, chills, or any other new severe symptoms.

## 2020-04-28 NOTE — ED Notes (Signed)
ED Provider at bedside. 

## 2020-04-28 NOTE — ED Triage Notes (Signed)
Pt states she had pain to her RLQ since around 6 am this morning, had a BM and that's when she felt the bust. Pt has also had NV

## 2020-04-28 NOTE — ED Notes (Signed)
Patient transported to CT 

## 2020-04-28 NOTE — ED Notes (Signed)
Pt given strainers and hat to catch stone, instructions in use

## 2021-06-24 IMAGING — MG MM DIGITAL DIAGNOSTIC UNILAT*R* W/ TOMO W/ CAD
4 series · 4 of 12 positions shown · non-contrast
Comparison: Previous exam(s).

CLINICAL DATA: 45-year-old female for six-month follow-up of benign
biopsy of a UPPER-OUTER RIGHT breast asymmetry demonstrating PASH.

EXAM:
DIGITAL DIAGNOSTIC UNILATERAL RIGHT MAMMOGRAM WITH TOMOSYNTHESIS AND
CAD
TECHNIQUE: Right digital diagnostic mammography and breast tomosynthesis was
performed. The images were evaluated with computer-aided detection.

[R MLO synth-2D]
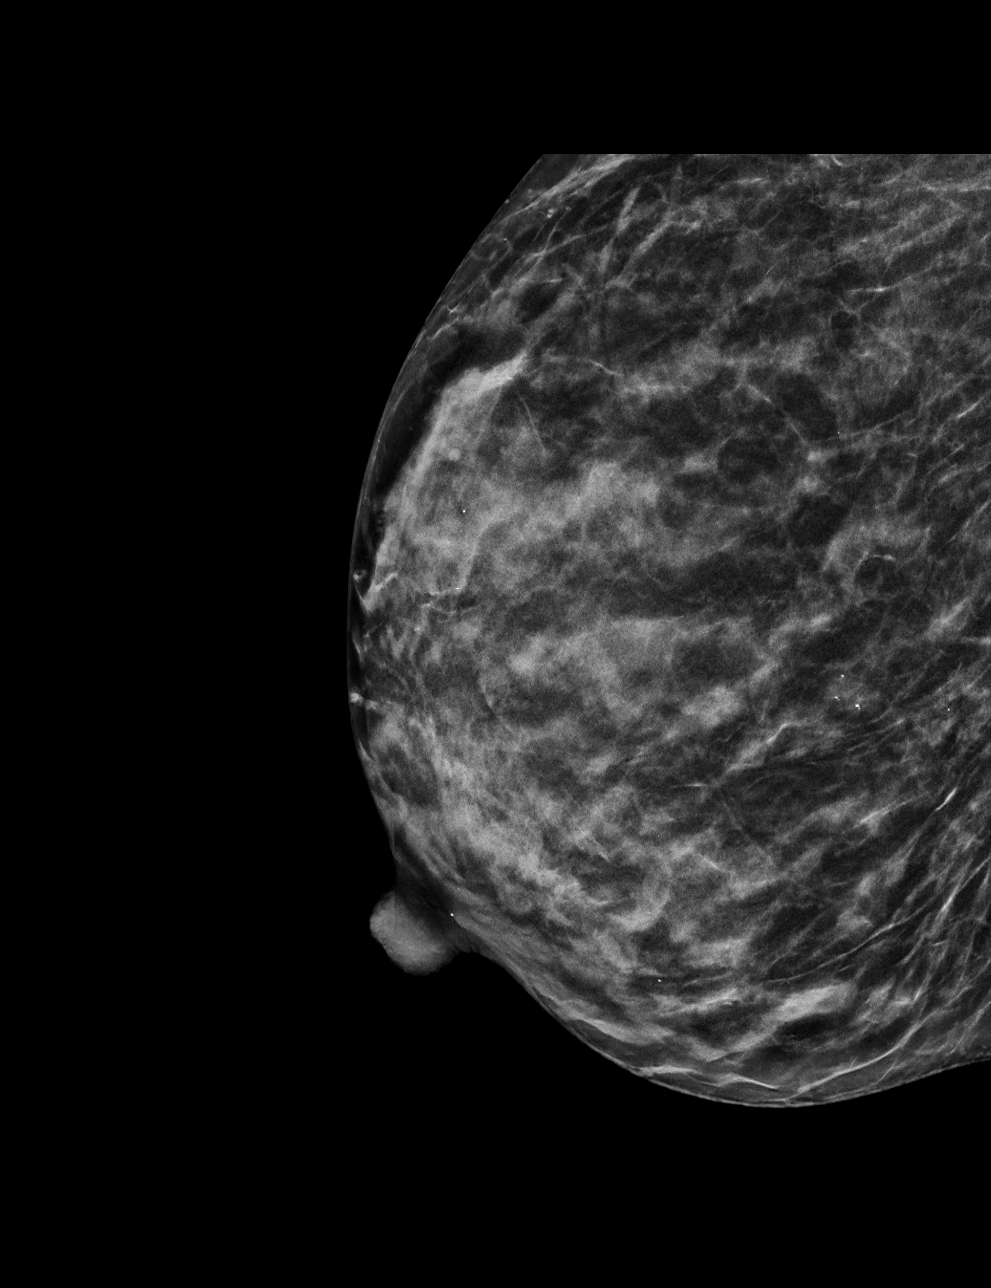

[R CC synth-2D]
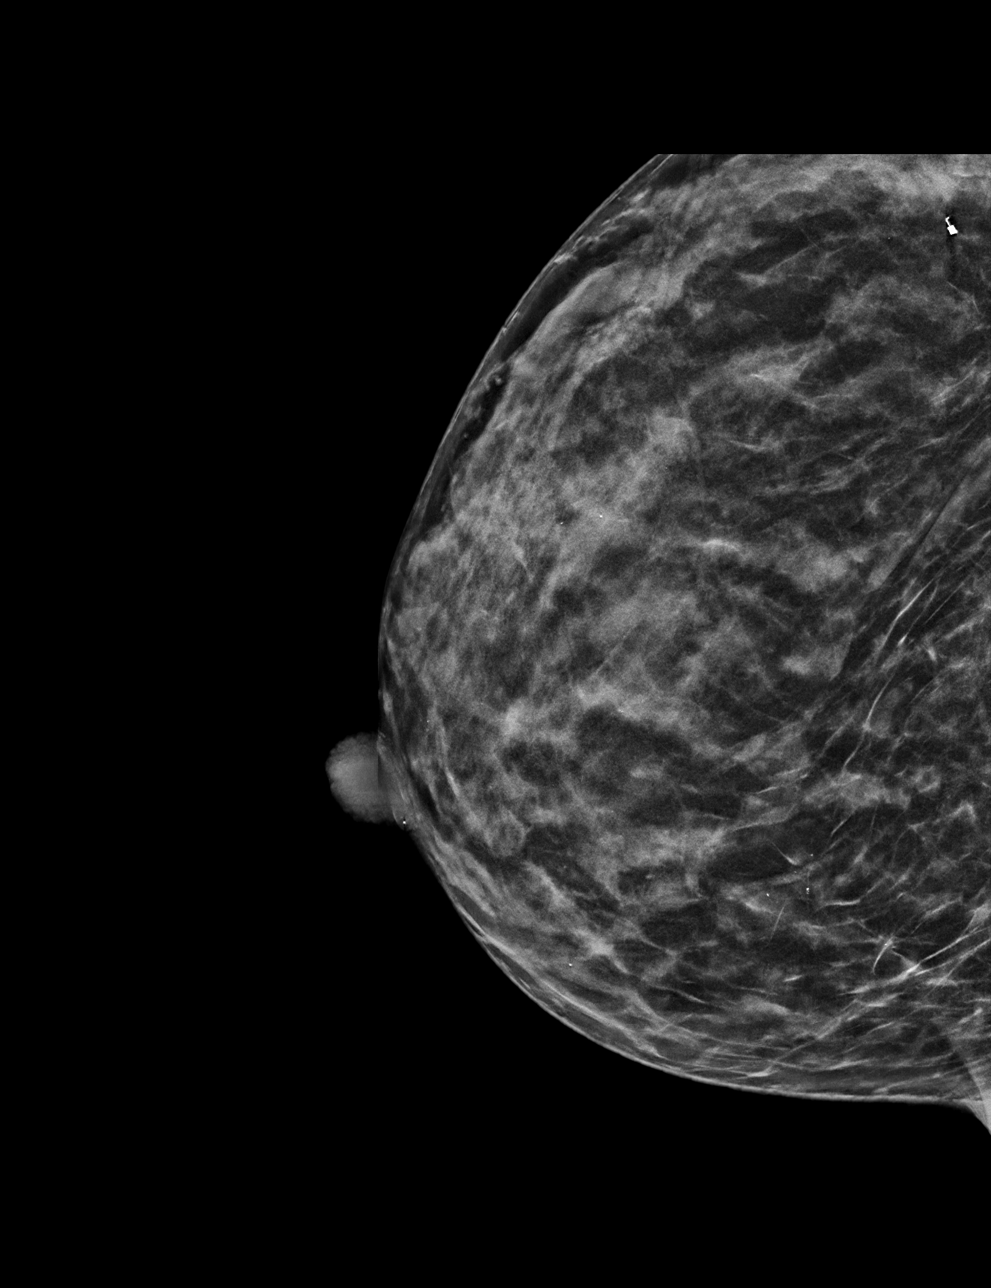

[R MLO tomo · tomo slice 25/48.0]
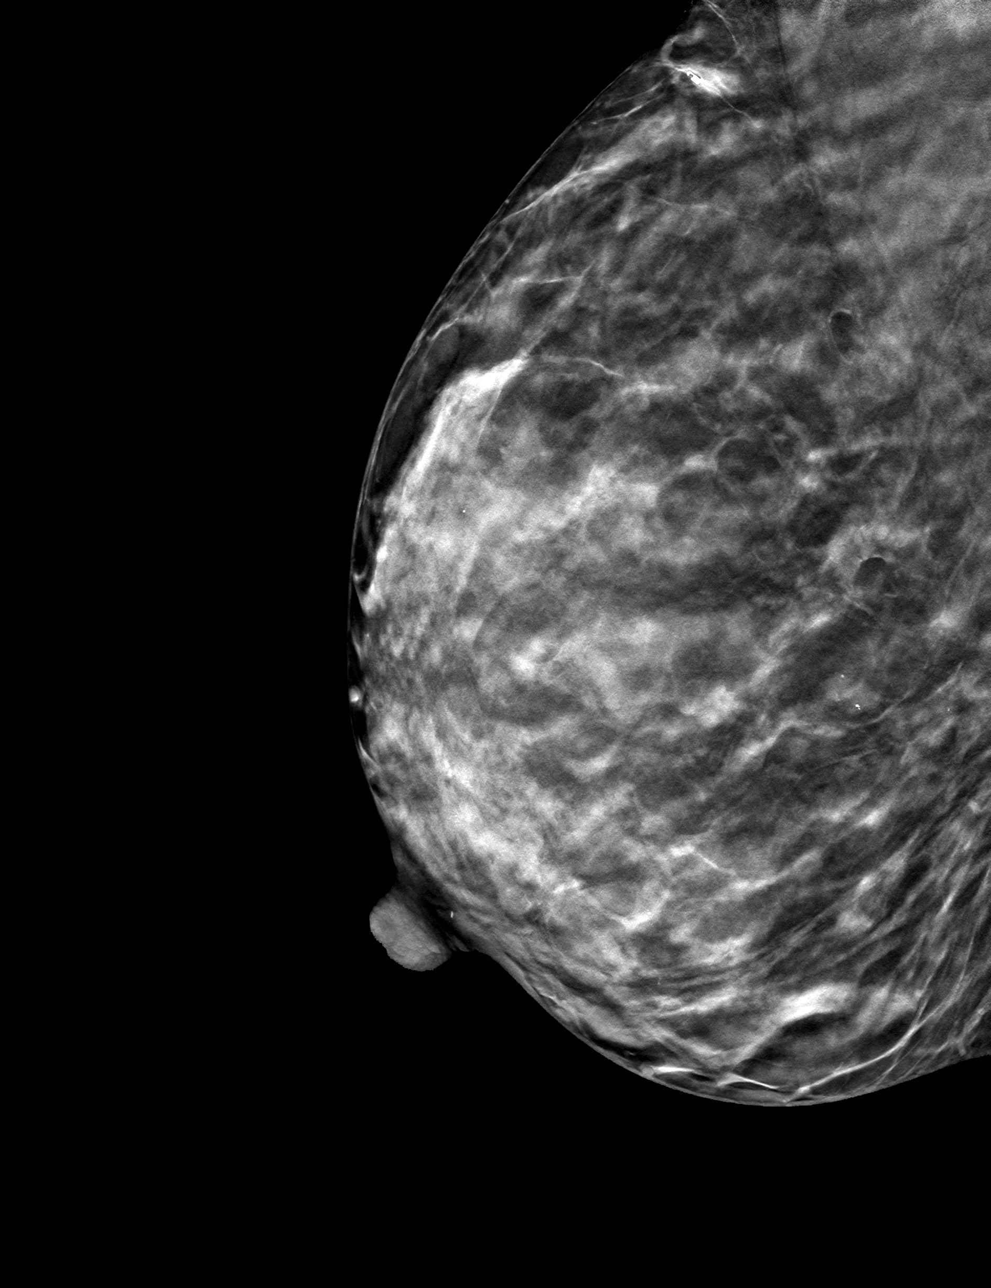

[R CC tomo · tomo slice 25/49.0]
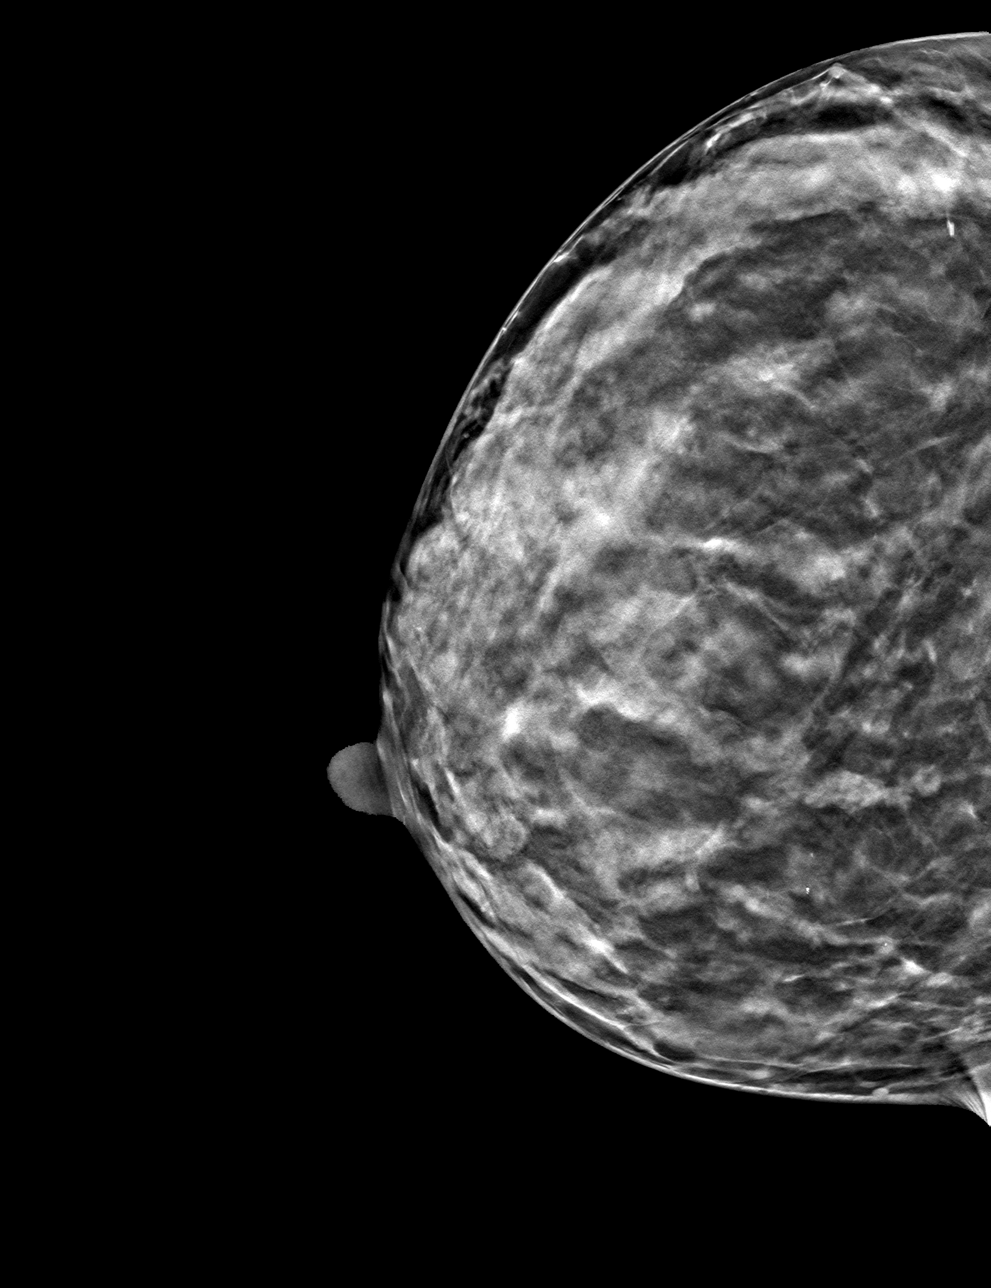

[4 of 12 positions shown; findings below may reference images not displayed]

ACR Breast Density Category c: The breast tissue is heterogeneously
dense, which may obscure small masses.
FINDINGS: 2D/3D full field views of the RIGHT breast demonstrate a COIL biopsy
clip adjacent to an unchanged asymmetry within the UPPER-OUTER RIGHT
breast.

No new or suspicious abnormalities are noted within the RIGHT
breast.
IMPRESSION: 1. Unchanged UPPER-OUTER RIGHT breast asymmetry, biopsy proven to be
PASH.
2. No new or suspicious mammographic findings within the RIGHT
breast.

RECOMMENDATION:
Bilateral screening mammogram in 4 months to resume annual mammogram
schedule.

I have discussed the findings and recommendations with the patient.
If applicable, a reminder letter will be sent to the patient
regarding the next appointment.

BI-RADS CATEGORY  2: Benign.

## 2021-11-12 ENCOUNTER — Other Ambulatory Visit: Payer: Self-pay | Admitting: Obstetrics and Gynecology

## 2021-11-12 DIAGNOSIS — N644 Mastodynia: Secondary | ICD-10-CM

## 2021-11-22 ENCOUNTER — Ambulatory Visit: Payer: 59

## 2021-11-22 ENCOUNTER — Ambulatory Visit
Admission: RE | Admit: 2021-11-22 | Discharge: 2021-11-22 | Disposition: A | Discharge status: Home / Self Care | Payer: 59 | Source: Ambulatory Visit | Attending: Obstetrics and Gynecology | Admitting: Obstetrics and Gynecology

## 2021-11-22 DIAGNOSIS — N644 Mastodynia: Secondary | ICD-10-CM

## 2022-06-26 ENCOUNTER — Encounter: Payer: Self-pay | Admitting: Hematology and Oncology

## 2022-08-20 ENCOUNTER — Emergency Department (HOSPITAL_BASED_OUTPATIENT_CLINIC_OR_DEPARTMENT_OTHER): Payer: 59

## 2022-08-20 ENCOUNTER — Emergency Department (HOSPITAL_BASED_OUTPATIENT_CLINIC_OR_DEPARTMENT_OTHER)
Admission: EM | Admit: 2022-08-20 | Discharge: 2022-08-20 | Disposition: A | Discharge status: Home / Self Care | Payer: 59 | Attending: Emergency Medicine | Admitting: Emergency Medicine

## 2022-08-20 ENCOUNTER — Other Ambulatory Visit: Payer: Self-pay

## 2022-08-20 DIAGNOSIS — G8929 Other chronic pain: Secondary | ICD-10-CM | POA: Diagnosis not present

## 2022-08-20 DIAGNOSIS — T50905A Adverse effect of unspecified drugs, medicaments and biological substances, initial encounter: Secondary | ICD-10-CM

## 2022-08-20 DIAGNOSIS — K209 Esophagitis, unspecified without bleeding: Secondary | ICD-10-CM | POA: Diagnosis not present

## 2022-08-20 DIAGNOSIS — R102 Pelvic and perineal pain: Secondary | ICD-10-CM | POA: Diagnosis not present

## 2022-08-20 DIAGNOSIS — J029 Acute pharyngitis, unspecified: Secondary | ICD-10-CM | POA: Diagnosis present

## 2022-08-20 MED ORDER — LIDOCAINE VISCOUS HCL 2 % MT SOLN
15.0000 mL | Freq: Once | OROMUCOSAL | Status: AC
  Administered 2022-08-20: 15 mL via ORAL
  Filled 2022-08-20: qty 15

## 2022-08-20 MED ORDER — ALUM & MAG HYDROXIDE-SIMETH 200-200-20 MG/5ML PO SUSP
30.0000 mL | Freq: Once | ORAL | Status: AC
  Administered 2022-08-20: 30 mL via ORAL
  Filled 2022-08-20: qty 30

## 2022-08-20 NOTE — ED Triage Notes (Signed)
Patient presents to ED via POV from home. Patient is currently on doxycycline for a pelvic infection. Only 4 left. Patient reports sore throat. States "its like the pill is getting stuck in my throat". Even and non labored respirations noted. Able to speak full, complete sentences.

## 2022-08-20 NOTE — ED Notes (Signed)
Patient transported to [facility] 

## 2022-08-20 NOTE — ED Provider Notes (Signed)
[facility] [facility] Provider Note   CSN: [id number] Arrival date & time: 08/20/22  1623     History  Chief Complaint  Patient presents with   Sore Throat    Jo [name] is a 47 y.o. female with past medical history significant for uterine fibroid, seizures, anemia presents to the ED complaining of sore throat and difficulty swallowing as well as ongoing pelvic pain.  Patient currently on doxycycline for a pelvic infection with only 2 days left.  She states that on Saturday she began having irritation in her throat which has worsened to where it feels "like the pill is getting stuck in my throat".  She has been able to manage her own secretions, but does experience significant pain when she swallows.  Patient has had decreased intake of food and drink due to the pain.  Denies fever, shortness of breath, stridor, wheezing, chest pain, nausea, vomiting, urinary symptoms, vaginal bleeding or discharge.         Home Medications Prior to Admission medications   Medication Sig Start Date End Date Taking? Authorizing Provider  diazepam (DIASTAT ACUDIAL) 20 MG GEL Place 20 mg rectally once as needed for up to 1 dose (seizure longer than 10 minutes). 03/05/20   Mesner, Barbara Cower, MD  Ferrous Sulfate (IRON PO) Take by mouth.    [provider]  IBUPROFEN PO Take by mouth.    [provider]  ondansetron (ZOFRAN ODT) 4 MG disintegrating tablet Take 1 tablet (4 mg total) by mouth every 8 (eight) hours as needed for nausea or vomiting. 04/28/20   Sponseller, Eugene Gavia, PA-C  oxyCODONE-acetaminophen (PERCOCET/ROXICET) 5-325 MG tablet Take 2 tablets by mouth every 4 (four) hours as needed for severe pain. 04/28/20   Sponseller, Eugene Gavia, PA-C  tamsulosin (FLOMAX) 0.4 MG CAPS capsule Take 1 capsule (0.4 mg total) by mouth daily. 04/28/20   Sponseller, Eugene Gavia, PA-C  zonisamide (ZONEGRAN) 100 MG capsule Take 100 mg by mouth daily.    [provider]      Allergies    Patient has no known allergies.    Review of Systems   Review of Systems  Constitutional:  Negative for fever.  HENT:  Positive for sore throat and trouble swallowing.   Respiratory:  Negative for shortness of breath, wheezing and stridor.   Cardiovascular:  Negative for chest pain.  Gastrointestinal:  Positive for abdominal pain. Negative for nausea and vomiting.  Genitourinary:  Positive for pelvic pain. Negative for difficulty urinating, dysuria, frequency, urgency, vaginal bleeding, vaginal discharge and vaginal pain.    Physical Exam Updated Vital Signs BP (!) 116/110 (BP Location: Right Arm)   Pulse 90   Temp 97.8 F (36.6 C) (Oral)   Resp 18   Ht 5\' 1"  (1.549 m)   Wt 63.5 kg   SpO2 98%   BMI 26.45 kg/m  Physical Exam Vitals and nursing note reviewed.  Constitutional:      General: She is not in acute distress.    Appearance: Normal appearance. She is not ill-appearing or diaphoretic.  HENT:     Mouth/Throat:     Lips: Pink.     Mouth: Mucous membranes are moist.     Pharynx: Oropharynx is clear. Uvula midline. Posterior oropharyngeal erythema present. No pharyngeal swelling, oropharyngeal exudate or uvula swelling.  Cardiovascular:     Rate and Rhythm: Normal rate and regular rhythm.  Pulmonary:     Effort: Pulmonary effort is normal.  No tachypnea or respiratory distress.     Breath sounds: No stridor.  Abdominal:     General: Abdomen is flat.     Palpations: Abdomen is soft.     Tenderness: There is abdominal tenderness in the suprapubic area. There is no guarding.  Musculoskeletal:     Cervical back: Full passive range of motion without pain.  Skin:    General: Skin is warm and dry.     Capillary Refill: Capillary refill takes less than 2 seconds.  Neurological:     Mental Status: She is alert. Mental status is at baseline.  Psychiatric:        Mood and Affect: Mood normal.        Behavior: Behavior normal.     ED  Results / Procedures / Treatments   Labs (all labs ordered are listed, but only abnormal results are displayed) Labs Reviewed - No data to display  EKG None  Radiology US PELVIC COMPLETE WITH TRANSVAGINAL  Result Date: 08/20/2022 CLINICAL DATA:  Pelvic and right lower quadrant pain EXAM: TRANSABDOMINAL AND TRANSVAGINAL ULTRASOUND OF PELVIS TECHNIQUE: Both transabdominal and transvaginal ultrasound examinations of the pelvis were performed. Transabdominal technique was performed for global imaging of the pelvis including uterus, ovaries, adnexal regions, and pelvic cul-de-sac. It was necessary to proceed with endovaginal exam following the transabdominal exam to visualize the endometrium and adnexal structures. COMPARISON:  04/28/2020 FINDINGS: Uterus Measurements: 9.2 x 5.0 x 8.2 cm = volume: 176 mL. Likely intramural fibroid within the right lateral aspect of the uterine fundus measuring 3.7 x 3.1 x 2.9 cm. Postsurgical changes from prior C-section. Endometrium Thickness: 4 mm.  No focal abnormality visualized. Right ovary Measurements: 3.7 x 2.8 x 2.8 cm = volume: 15.2 mL. 3 cm simple cystic structure within the right adnexa consistent with dominant follicle. Otherwise unremarkable appearance. Left ovary Measurements: 3.2 x 1.6 x 2.4 cm = volume: 6.4 mL. Normal appearance/no adnexal mass. Other findings No abnormal free fluid. IMPRESSION: 1. Stable uterine fibroid. 2. Dominant 3 cm right ovarian follicle. 3. Otherwise unremarkable exam. Electronically Signed   By: Sharlet Salina M.D.   On: 08/20/2022 20:15   DG Neck Soft Tissue  Result Date: 08/20/2022 CLINICAL DATA:  Foreign body sensation. EXAM: NECK SOFT TISSUES - 1+ VIEW COMPARISON:  None Available. FINDINGS: There is no evidence of retropharyngeal soft tissue swelling or epiglottic enlargement. The cervical airway is unremarkable and no radio-opaque foreign body identified. Surgical clips are seen in left cervical region. Status post left  mandibular repair. IMPRESSION: No acute abnormality seen. Electronically Signed   By: Lupita Raider M.D.   On: 08/20/2022 18:36   DG Chest 1 View  Result Date: 08/20/2022 CLINICAL DATA:  Foreign body sensation. EXAM: CHEST  1 VIEW COMPARISON:  June 15, 2019. FINDINGS: The heart size and mediastinal contours are within normal limits. Both lungs are clear. The visualized skeletal structures are unremarkable. IMPRESSION: No active disease. Electronically Signed   By: Lupita Raider M.D.   On: 08/20/2022 18:34    Procedures Procedures    Medications Ordered in ED Medications  alum & mag hydroxide-simeth (MAALOX/MYLANTA) 200-200-20 MG/5ML suspension 30 mL (30 mLs Oral Given 08/20/22 2005)    And  lidocaine (XYLOCAINE) 2 % viscous mouth solution 15 mL (15 mLs Oral Given 08/20/22 2006)    ED Course/ Medical Decision Making/ A&P  Medical Decision Making Amount and/or Complexity of Data Reviewed Radiology: ordered.  Risk OTC drugs. Prescription drug management.   This patient presents to the ED with chief complaint(s) of sore throat with trouble swallowing, ongoing pelvis pain with pertinent past medical history of uterine fibroid, currently on antibiotics for suspected pelvic infection.  The complaint involves an extensive differential diagnosis and also carries with it a high risk of complications and morbidity.    The differential diagnosis includes pill esophagitis, globus sensation, esophageal foreign body, GERD, uterine fibroids, endometriosis, neoplasm   Initial Assessment:   Exam significant for erythematous posterior oropharynx without exudate.  No postnasal drip.  Patient is speaking in full sentences and does not have difficulty breathing.  No stridor.  Patient able to swallow, but experiences increased pain.  Abdomen is soft with suprapubic tenderness.  No appreciable hernias or distension.    Independent interpretation of imaging: I ordered and  personally interpreted x-ray of soft tissue neck and chest which did not demonstrate radiopaque foreign body.  There is no esophageal stricture or obvious food impaction.  Pelvic ultrasound was also ordered and demonstrated uterine fibroid and right-sided ovarian follicle, but was otherwise unremarkable.  Treatment and Reassessment: Patient given Maalox with viscous lidocaine with minimal improvement in her symptoms.  Patient managing her own secretions and is able to swallow, but does still experience some discomfort.  Disposition:   Patient's workup today is overall reassuring.  Suspect she has pill esophagitis related to doxycycline.  Discussed supportive care measures at home including the use of liquid acetaminophen and/or ibuprofen to help with pain and swelling.  Patient has follow-up with OB/GYN on Friday.  Patient was given a copy of her ultrasound results.  The patient has been appropriately medically screened and/or stabilized in the ED. I have low suspicion for any other emergent medical condition which would require further screening, evaluation or treatment in the ED or require inpatient management. At time of discharge the patient is hemodynamically stable and in no acute distress. I have discussed work-up results and diagnosis with patient and answered all questions. Patient is agreeable with discharge plan. We discussed strict return precautions for returning to the emergency department and they verbalized understanding.            Final Clinical Impression(s) / ED Diagnoses Final diagnoses:  Pill esophagitis  Chronic pelvic pain in female    Rx / DC Orders ED Discharge Orders     None         Lenard Simmer, PA-C 08/20/22 2124    Franne Forts, DO 08/28/22 1633

## 2022-08-20 NOTE — ED Notes (Signed)
Pt has questions regarding transvaginal US. EDP notified and will speak with pt

## 2022-08-20 NOTE — Discharge Instructions (Addendum)
Thank you for allowing me to be part of your care today.  You were evaluated in the ED for painful and difficulty swallowing as well as ongoing pelvic pain.  Your sore throat is likely secondary to taking the doxycycline and resulted in something called pill esophagitis.  The treatment for this is stopping the doxycycline and using liquid Motrin and/or Tylenol to help with pain and swelling.  I also recommend eating soft foods and drinking liquids that we will not further irritate your throat.  You may alternate 20 mL (400 mg) of liquid ibuprofen with 20 mL (640 mg) of liquid acetaminophen every 4 hours as needed for sore throat.    Please follow up with your OBGYN as scheduled.  Return to the ED if you develop sudden worsening of your symptoms or if you have any new concerns.

## 2022-09-12 ENCOUNTER — Emergency Department (HOSPITAL_BASED_OUTPATIENT_CLINIC_OR_DEPARTMENT_OTHER)
Admission: EM | Admit: 2022-09-12 | Discharge: 2022-09-12 | Disposition: A | Discharge status: Home / Self Care | Payer: 59 | Attending: Emergency Medicine | Admitting: Emergency Medicine

## 2022-09-12 ENCOUNTER — Emergency Department (HOSPITAL_BASED_OUTPATIENT_CLINIC_OR_DEPARTMENT_OTHER): Payer: 59

## 2022-09-12 ENCOUNTER — Encounter (HOSPITAL_BASED_OUTPATIENT_CLINIC_OR_DEPARTMENT_OTHER): Payer: Self-pay

## 2022-09-12 ENCOUNTER — Other Ambulatory Visit: Payer: Self-pay

## 2022-09-12 DIAGNOSIS — N200 Calculus of kidney: Secondary | ICD-10-CM | POA: Diagnosis not present

## 2022-09-12 DIAGNOSIS — R1031 Right lower quadrant pain: Secondary | ICD-10-CM | POA: Diagnosis present

## 2022-09-12 LAB — CBC
HCT: 46.8 % — ABNORMAL HIGH (ref 36.0–46.0)
Hemoglobin: 15.5 g/dL — ABNORMAL HIGH (ref 12.0–15.0)
MCH: 29.9 pg (ref 26.0–34.0)
MCHC: 33.1 g/dL (ref 30.0–36.0)
MCV: 90.2 fL (ref 80.0–100.0)
Platelets: 205 10*3/uL (ref 150–400)
RBC: 5.19 MIL/uL — ABNORMAL HIGH (ref 3.87–5.11)
RDW: 13 % (ref 11.5–15.5)
WBC: 8.8 10*3/uL (ref 4.0–10.5)
nRBC: 0 % (ref 0.0–0.2)

## 2022-09-12 LAB — COMPREHENSIVE METABOLIC PANEL
ALT: 17 U/L (ref 0–44)
AST: 23 U/L (ref 15–41)
Albumin: 4.4 g/dL (ref 3.5–5.0)
Alkaline Phosphatase: 62 U/L (ref 38–126)
Anion gap: 11 (ref 5–15)
BUN: 14 mg/dL (ref 6–20)
CO2: 22 mmol/L (ref 22–32)
Calcium: 9.6 mg/dL (ref 8.9–10.3)
Chloride: 103 mmol/L (ref 98–111)
Creatinine, Ser: 0.94 mg/dL (ref 0.44–1.00)
GFR, Estimated: >60 mL/min (ref >60)
Glucose, Bld: 97 mg/dL (ref 70–99)
Potassium: 3.8 mmol/L (ref 3.5–5.1)
Sodium: 136 mmol/L (ref 135–145)
Total Bilirubin: 0.8 mg/dL (ref 0.3–1.2)
Total Protein: 7.9 g/dL (ref 6.5–8.1)

## 2022-09-12 LAB — PREGNANCY, URINE: Preg Test, Ur: NEGATIVE

## 2022-09-12 LAB — URINALYSIS, ROUTINE W REFLEX MICROSCOPIC
Bilirubin Urine: NEGATIVE
Glucose, UA: NEGATIVE mg/dL
Ketones, ur: 15 mg/dL — AB
Leukocytes,Ua: NEGATIVE
Nitrite: NEGATIVE
Protein, ur: NEGATIVE mg/dL
Specific Gravity, Urine: >=1.03 (ref 1.005–1.030)
pH: 6.5 (ref 5.0–8.0)

## 2022-09-12 LAB — URINALYSIS, MICROSCOPIC (REFLEX): WBC, UA: NONE SEEN WBC/hpf (ref 0–5)

## 2022-09-12 LAB — LIPASE, BLOOD: Lipase: 42 U/L (ref 11–51)

## 2022-09-12 MED ORDER — TAMSULOSIN HCL 0.4 MG PO CAPS: 0.4000 mg | ORAL_CAPSULE | Freq: Every day | ORAL | 0 refills | Status: DC

## 2022-09-12 MED ORDER — HYDROCODONE-ACETAMINOPHEN 5-325 MG PO TABS: 1.0000 | ORAL_TABLET | ORAL | 0 refills | Status: DC | PRN

## 2022-09-12 MED ORDER — MORPHINE SULFATE (PF) 4 MG/ML IV SOLN
4.0000 mg | Freq: Once | INTRAVENOUS | Status: AC
  Administered 2022-09-12: 4 mg via INTRAVENOUS
  Filled 2022-09-12: qty 1

## 2022-09-12 MED ORDER — KETOROLAC TROMETHAMINE 15 MG/ML IJ SOLN
15.0000 mg | Freq: Once | INTRAMUSCULAR | Status: AC
  Administered 2022-09-12: 15 mg via INTRAVENOUS
  Filled 2022-09-12: qty 1

## 2022-09-12 MED ORDER — ONDANSETRON 4 MG PO TBDP: ORAL_TABLET | ORAL | 0 refills | Status: DC

## 2022-09-12 NOTE — ED Triage Notes (Signed)
Pt reports severe RLQ abdominal pain that radiates around her side and into her right flank - onset today at 4pm associated with nausea and vomiting. Denies dysuria/hematuria.

## 2022-09-12 NOTE — ED Provider Notes (Signed)
[place] [facility] Provider Note   CSN: [id number] Arrival date & time: 09/12/22  1855     History  Chief Complaint  Patient presents with   Abdominal Pain    Jo [name] is a 47 y.o. female.  Patient is a 47 year old female who presents with abdominal pain.  She said it started about 2 hours prior to arrival.  Send her right abdomen and right flank area.  She does have a known history of prior kidney stones.  She has had some associated nausea and vomiting related to the pain.  She denies any known fevers.  She says that pain increases with urination.       Home Medications Prior to Admission medications   Medication Sig Start Date End Date Taking? Authorizing Provider  HYDROcodone-acetaminophen (NORCO/VICODIN) 5-325 MG tablet Take 1-2 tablets by mouth every 4 (four) hours as needed. 09/12/22  Yes Rolan Bucco, MD  ondansetron (ZOFRAN-ODT) 4 MG disintegrating tablet 4mg  ODT q4 hours prn nausea/vomit 09/12/22  Yes Rolan Bucco, MD  tamsulosin (FLOMAX) 0.4 MG CAPS capsule Take 1 capsule (0.4 mg total) by mouth daily. 09/12/22  Yes Rolan Bucco, MD  diazepam (DIASTAT ACUDIAL) 20 MG GEL Place 20 mg rectally once as needed for up to 1 dose (seizure longer than 10 minutes). 03/05/20   Mesner, Barbara Cower, MD  Ferrous Sulfate (IRON PO) Take by mouth.    [provider]  IBUPROFEN PO Take by mouth.    [provider]  zonisamide (ZONEGRAN) 100 MG capsule Take 100 mg by mouth daily.    [provider]      Allergies    Patient has no known allergies.    Review of Systems   Review of Systems  Constitutional:  Negative for chills, diaphoresis, fatigue and fever.  HENT:  Negative for congestion, rhinorrhea and sneezing.   Eyes: Negative.   Respiratory:  Negative for cough, chest tightness and shortness of breath.   Cardiovascular:  Negative for chest pain and leg swelling.  Gastrointestinal:  Positive for  abdominal pain, nausea and vomiting. Negative for blood in stool and diarrhea.  Genitourinary:  Positive for flank pain. Negative for difficulty urinating, frequency and hematuria.  Musculoskeletal:  Negative for arthralgias and back pain.  Skin:  Negative for rash.  Neurological:  Negative for dizziness, speech difficulty, weakness, numbness and headaches.    Physical Exam Updated Vital Signs BP (!) 149/103 (BP Location: Right Arm)   Pulse 80   Temp 98.6 F (37 C) (Oral)   Resp 18   Ht 5\' 1"  (1.549 m)   Wt 63.5 kg   LMP  (Approximate)   SpO2 98%   BMI 26.45 kg/m  Physical Exam Constitutional:      Appearance: She is well-developed.  HENT:     Head: Normocephalic and atraumatic.  Eyes:     Pupils: Pupils are equal, round, and reactive to light.  Cardiovascular:     Rate and Rhythm: Normal rate and regular rhythm.     Heart sounds: Normal heart sounds.  Pulmonary:     Effort: Pulmonary effort is normal. No respiratory distress.     Breath sounds: Normal breath sounds. No wheezing or rales.  Chest:     Chest wall: No tenderness.  Abdominal:     General: Bowel sounds are normal.     Palpations: Abdomen is soft.     Tenderness: There is abdominal tenderness in the right upper quadrant. There is no  guarding or rebound.     Comments: Right upper mid abdomen  Musculoskeletal:        General: Normal range of motion.     Cervical back: Normal range of motion and neck supple.  Lymphadenopathy:     Cervical: No cervical adenopathy.  Skin:    General: Skin is warm and dry.     Findings: No rash.  Neurological:     Mental Status: She is alert and oriented to person, place, and time.     ED Results / Procedures / Treatments   Labs (all labs ordered are listed, but only abnormal results are displayed) Labs Reviewed  CBC - Abnormal; Notable for the following components:      Result Value   RBC 5.19 (*)    Hemoglobin 15.5 (*)    HCT 46.8 (*)    All other components within  normal limits  URINALYSIS, ROUTINE W REFLEX MICROSCOPIC - Abnormal; Notable for the following components:   Hgb urine dipstick MODERATE (*)    Ketones, ur 15 (*)    All other components within normal limits  URINALYSIS, MICROSCOPIC (REFLEX) - Abnormal; Notable for the following components:   Bacteria, UA RARE (*)    All other components within normal limits  LIPASE, BLOOD  COMPREHENSIVE METABOLIC PANEL  PREGNANCY, URINE    EKG None  Radiology CT Renal Stone Study  Result Date: 09/12/2022 CLINICAL DATA:  Abdominal/flank pain, stone suspected EXAM: CT ABDOMEN AND PELVIS WITHOUT CONTRAST TECHNIQUE: Multidetector CT imaging of the abdomen and pelvis was performed following the standard protocol without IV contrast. RADIATION DOSE REDUCTION: This exam was performed according to the departmental dose-optimization program which includes automated exposure control, adjustment of the mA and/or kV according to patient size and/or use of iterative reconstruction technique. COMPARISON:  CT abdomen and pelvis 04/28/2020 FINDINGS: Lower chest: No acute abnormality. Hepatobiliary: Unremarkable liver. Normal gallbladder. No biliary dilation. Pancreas: Unremarkable. Spleen: Unremarkable. Adrenals/Urinary Tract: Stable adrenal glands. Tiny nonobstructing calyceal stones in the left kidney. No left hydronephrosis. Moderate right hydroureteronephrosis upstream from a 4 mm stone in the distal right ureter. Unremarkable bladder. Stomach/Bowel: Normal caliber large and small bowel. No bowel wall thickening. The appendix is normal.Stomach is within normal limits. Vascular/Lymphatic: No significant vascular findings are present. No enlarged abdominal or pelvic lymph nodes. Reproductive: Unremarkable. Other: No free intraperitoneal fluid or air. Musculoskeletal: No acute fracture. IMPRESSION: Moderate right hydroureteronephrosis upstream from a 4 mm stone in the distal right ureter. Electronically Signed   By: Minerva Fester M.D.   On: 09/12/2022 20:27    Procedures Procedures    Medications Ordered in ED Medications  morphine (PF) 4 MG/ML injection 4 mg (4 mg Intravenous Given 09/12/22 2027)  ketorolac (TORADOL) 15 MG/ML injection 15 mg (15 mg Intravenous Given 09/12/22 2102)    ED Course/ Medical Decision Making/ A&P                             Medical Decision Making Amount and/or Complexity of Data Reviewed Labs: ordered. Radiology: ordered.  Risk Prescription drug management.   Patient is a 47 year old female who presents with sudden onset of right flank pain.  She has some hematuria on her urinalysis but no signs of infection.  Other labs are nonconcerning.  Pregnancy test is negative.  She had a CT of her abdomen pelvis which showed a distal right ureteral stone.  She was given IV pain medication and is  feeling better after this.  She is appropriate for discharge.  She was discharged home in good condition.  She was given prescriptions for Vicodin, Zofran and Flomax.  She was encouraged to make an appointment to follow-up with [facility] where she has been seen before.  Return precautions were given.  Final Clinical Impression(s) / ED Diagnoses Final diagnoses:  Kidney stone    Rx / DC Orders ED Discharge Orders          Ordered    HYDROcodone-acetaminophen (NORCO/VICODIN) 5-325 MG tablet  Every 4 hours PRN        09/12/22 2122    ondansetron (ZOFRAN-ODT) 4 MG disintegrating tablet        09/12/22 2122    tamsulosin (FLOMAX) 0.4 MG CAPS capsule  Daily        09/12/22 2122              Rolan Bucco, MD 09/12/22 2125

## 2022-10-09 ENCOUNTER — Other Ambulatory Visit: Payer: Self-pay

## 2022-10-09 ENCOUNTER — Encounter (HOSPITAL_COMMUNITY): Payer: Self-pay

## 2022-10-09 ENCOUNTER — Inpatient Hospital Stay (HOSPITAL_COMMUNITY)
Admission: EM | Admit: 2022-10-09 | Discharge: 2022-10-12 | DRG: 920 | Disposition: A | Discharge status: Home / Self Care | Payer: 59 | Attending: Family Medicine | Admitting: Family Medicine

## 2022-10-09 DIAGNOSIS — G40909 Epilepsy, unspecified, not intractable, without status epilepticus: Secondary | ICD-10-CM

## 2022-10-09 DIAGNOSIS — D62 Acute posthemorrhagic anemia: Secondary | ICD-10-CM | POA: Diagnosis present

## 2022-10-09 DIAGNOSIS — K91840 Postprocedural hemorrhage and hematoma of a digestive system organ or structure following a digestive system procedure: Secondary | ICD-10-CM | POA: Diagnosis not present

## 2022-10-09 DIAGNOSIS — G40109 Localization-related (focal) (partial) symptomatic epilepsy and epileptic syndromes with simple partial seizures, not intractable, without status epilepticus: Secondary | ICD-10-CM | POA: Diagnosis present

## 2022-10-09 DIAGNOSIS — D127 Benign neoplasm of rectosigmoid junction: Secondary | ICD-10-CM | POA: Diagnosis present

## 2022-10-09 DIAGNOSIS — Z8249 Family history of ischemic heart disease and other diseases of the circulatory system: Secondary | ICD-10-CM

## 2022-10-09 DIAGNOSIS — K633 Ulcer of intestine: Secondary | ICD-10-CM | POA: Diagnosis present

## 2022-10-09 DIAGNOSIS — Z98891 History of uterine scar from previous surgery: Secondary | ICD-10-CM

## 2022-10-09 DIAGNOSIS — Z833 Family history of diabetes mellitus: Secondary | ICD-10-CM

## 2022-10-09 DIAGNOSIS — G40209 Localization-related (focal) (partial) symptomatic epilepsy and epileptic syndromes with complex partial seizures, not intractable, without status epilepticus: Secondary | ICD-10-CM | POA: Diagnosis present

## 2022-10-09 DIAGNOSIS — K621 Rectal polyp: Secondary | ICD-10-CM | POA: Diagnosis present

## 2022-10-09 DIAGNOSIS — C089 Malignant neoplasm of major salivary gland, unspecified: Secondary | ICD-10-CM | POA: Diagnosis present

## 2022-10-09 DIAGNOSIS — Z79899 Other long term (current) drug therapy: Secondary | ICD-10-CM

## 2022-10-09 DIAGNOSIS — Z800 Family history of malignant neoplasm of digestive organs: Secondary | ICD-10-CM

## 2022-10-09 DIAGNOSIS — K921 Melena: Secondary | ICD-10-CM | POA: Diagnosis present

## 2022-10-09 DIAGNOSIS — Y838 Other surgical procedures as the cause of abnormal reaction of the patient, or of later complication, without mention of misadventure at the time of the procedure: Secondary | ICD-10-CM | POA: Diagnosis present

## 2022-10-09 DIAGNOSIS — K640 First degree hemorrhoids: Secondary | ICD-10-CM | POA: Diagnosis present

## 2022-10-09 DIAGNOSIS — K635 Polyp of colon: Secondary | ICD-10-CM | POA: Diagnosis present

## 2022-10-09 DIAGNOSIS — Z85818 Personal history of malignant neoplasm of other sites of lip, oral cavity, and pharynx: Secondary | ICD-10-CM

## 2022-10-09 MED ORDER — SODIUM CHLORIDE 0.9 % IV BOLUS
1000.0000 mL | Freq: Once | INTRAVENOUS | Status: AC
  Administered 2022-10-10: 1000 mL via INTRAVENOUS

## 2022-10-09 NOTE — ED Triage Notes (Signed)
Pt had an colonoscopy today where they removed some polyps and now she is still bleeding from that and feels weak.

## 2022-10-10 ENCOUNTER — Observation Stay (HOSPITAL_COMMUNITY): Payer: 59 | Admitting: Anesthesiology

## 2022-10-10 ENCOUNTER — Observation Stay (HOSPITAL_BASED_OUTPATIENT_CLINIC_OR_DEPARTMENT_OTHER): Payer: 59 | Admitting: Anesthesiology

## 2022-10-10 ENCOUNTER — Encounter (HOSPITAL_COMMUNITY)
Admission: EM | Disposition: A | Discharge status: Home / Self Care | Payer: Self-pay | Source: Home / Self Care | Attending: Family Medicine

## 2022-10-10 ENCOUNTER — Encounter (HOSPITAL_COMMUNITY): Payer: Self-pay | Admitting: Internal Medicine

## 2022-10-10 DIAGNOSIS — C089 Malignant neoplasm of major salivary gland, unspecified: Secondary | ICD-10-CM | POA: Diagnosis not present

## 2022-10-10 DIAGNOSIS — K640 First degree hemorrhoids: Secondary | ICD-10-CM | POA: Diagnosis present

## 2022-10-10 DIAGNOSIS — K633 Ulcer of intestine: Secondary | ICD-10-CM | POA: Diagnosis present

## 2022-10-10 DIAGNOSIS — Z79899 Other long term (current) drug therapy: Secondary | ICD-10-CM | POA: Diagnosis not present

## 2022-10-10 DIAGNOSIS — G40109 Localization-related (focal) (partial) symptomatic epilepsy and epileptic syndromes with simple partial seizures, not intractable, without status epilepticus: Secondary | ICD-10-CM | POA: Diagnosis present

## 2022-10-10 DIAGNOSIS — D127 Benign neoplasm of rectosigmoid junction: Secondary | ICD-10-CM | POA: Diagnosis present

## 2022-10-10 DIAGNOSIS — G40909 Epilepsy, unspecified, not intractable, without status epilepticus: Secondary | ICD-10-CM

## 2022-10-10 DIAGNOSIS — D62 Acute posthemorrhagic anemia: Secondary | ICD-10-CM | POA: Diagnosis present

## 2022-10-10 DIAGNOSIS — G40209 Localization-related (focal) (partial) symptomatic epilepsy and epileptic syndromes with complex partial seizures, not intractable, without status epilepticus: Secondary | ICD-10-CM

## 2022-10-10 DIAGNOSIS — K921 Melena: Secondary | ICD-10-CM | POA: Diagnosis present

## 2022-10-10 DIAGNOSIS — K635 Polyp of colon: Secondary | ICD-10-CM | POA: Diagnosis present

## 2022-10-10 DIAGNOSIS — Z833 Family history of diabetes mellitus: Secondary | ICD-10-CM | POA: Diagnosis not present

## 2022-10-10 DIAGNOSIS — K91840 Postprocedural hemorrhage and hematoma of a digestive system organ or structure following a digestive system procedure: Principal | ICD-10-CM

## 2022-10-10 DIAGNOSIS — Z8249 Family history of ischemic heart disease and other diseases of the circulatory system: Secondary | ICD-10-CM | POA: Diagnosis not present

## 2022-10-10 DIAGNOSIS — Z800 Family history of malignant neoplasm of digestive organs: Secondary | ICD-10-CM | POA: Diagnosis not present

## 2022-10-10 DIAGNOSIS — Z85818 Personal history of malignant neoplasm of other sites of lip, oral cavity, and pharynx: Secondary | ICD-10-CM | POA: Diagnosis not present

## 2022-10-10 DIAGNOSIS — K621 Rectal polyp: Secondary | ICD-10-CM | POA: Diagnosis present

## 2022-10-10 DIAGNOSIS — Y838 Other surgical procedures as the cause of abnormal reaction of the patient, or of later complication, without mention of misadventure at the time of the procedure: Secondary | ICD-10-CM | POA: Diagnosis present

## 2022-10-10 DIAGNOSIS — Z98891 History of uterine scar from previous surgery: Secondary | ICD-10-CM | POA: Diagnosis not present

## 2022-10-10 HISTORY — PX: FLEXIBLE SIGMOIDOSCOPY: SHX5431

## 2022-10-10 HISTORY — PX: SCLEROTHERAPY: SHX6841

## 2022-10-10 HISTORY — PX: HEMOSTASIS CLIP PLACEMENT: SHX6857

## 2022-10-10 LAB — CBC WITH DIFFERENTIAL/PLATELET
Abs Immature Granulocytes: 0.06 10*3/uL (ref 0.00–0.07)
Basophils Absolute: 0 10*3/uL (ref 0.0–0.1)
Basophils Relative: 0 %
Eosinophils Absolute: 0.1 10*3/uL (ref 0.0–0.5)
Eosinophils Relative: 1 %
HCT: 37 % (ref 36.0–46.0)
Hemoglobin: 12 g/dL (ref 12.0–15.0)
Immature Granulocytes: 1 %
Lymphocytes Relative: 10 %
Lymphs Abs: 1.3 10*3/uL (ref 0.7–4.0)
MCH: 29.8 pg (ref 26.0–34.0)
MCHC: 32.4 g/dL (ref 30.0–36.0)
MCV: 91.8 fL (ref 80.0–100.0)
Monocytes Absolute: 0.4 10*3/uL (ref 0.1–1.0)
Monocytes Relative: 3 %
Neutro Abs: 10.5 10*3/uL — ABNORMAL HIGH (ref 1.7–7.7)
Neutrophils Relative %: 85 %
Platelets: 203 10*3/uL (ref 150–400)
RBC: 4.03 MIL/uL (ref 3.87–5.11)
RDW: 12.8 % (ref 11.5–15.5)
WBC: 12.3 10*3/uL — ABNORMAL HIGH (ref 4.0–10.5)
nRBC: 0 % (ref 0.0–0.2)

## 2022-10-10 LAB — TYPE AND SCREEN
ABO/RH(D): O POS
Antibody Screen: NEGATIVE

## 2022-10-10 LAB — CBC
HCT: 30.7 % — ABNORMAL LOW (ref 36.0–46.0)
Hemoglobin: 9.8 g/dL — ABNORMAL LOW (ref 12.0–15.0)
MCH: 30.1 pg (ref 26.0–34.0)
MCHC: 31.9 g/dL (ref 30.0–36.0)
MCV: 94.2 fL (ref 80.0–100.0)
Platelets: 165 10*3/uL (ref 150–400)
RBC: 3.26 MIL/uL — ABNORMAL LOW (ref 3.87–5.11)
RDW: 12.9 % (ref 11.5–15.5)
WBC: 6.3 10*3/uL (ref 4.0–10.5)
nRBC: 0 % (ref 0.0–0.2)

## 2022-10-10 LAB — HIV ANTIBODY (ROUTINE TESTING W REFLEX): HIV Screen 4th Generation wRfx: NONREACTIVE

## 2022-10-10 LAB — BASIC METABOLIC PANEL
Anion gap: 9 (ref 5–15)
BUN: 12 mg/dL (ref 6–20)
CO2: 21 mmol/L — ABNORMAL LOW (ref 22–32)
Calcium: 8.8 mg/dL — ABNORMAL LOW (ref 8.9–10.3)
Chloride: 103 mmol/L (ref 98–111)
Creatinine, Ser: 0.88 mg/dL (ref 0.44–1.00)
GFR, Estimated: >60 mL/min (ref >60)
Glucose, Bld: 111 mg/dL — ABNORMAL HIGH (ref 70–99)
Potassium: 3.3 mmol/L — ABNORMAL LOW (ref 3.5–5.1)
Sodium: 133 mmol/L — ABNORMAL LOW (ref 135–145)

## 2022-10-10 SURGERY — SIGMOIDOSCOPY, FLEXIBLE
Anesthesia: Monitor Anesthesia Care

## 2022-10-10 MED ORDER — EPINEPHRINE 1 MG/10ML IJ SOSY
PREFILLED_SYRINGE | INTRAMUSCULAR | Status: DC | PRN
  Administered 2022-10-10: 12 mL

## 2022-10-10 MED ORDER — LACOSAMIDE 50 MG PO TABS
150.0000 mg | ORAL_TABLET | ORAL | Status: DC
  Administered 2022-10-10: 150 mg via ORAL
  Filled 2022-10-10: qty 3

## 2022-10-10 MED ORDER — LACTATED RINGERS IV SOLN: INTRAVENOUS | Status: DC

## 2022-10-10 MED ORDER — ONDANSETRON HCL 4 MG/2ML IJ SOLN: 4.0000 mg | Freq: Four times a day (QID) | INTRAMUSCULAR | Status: DC | PRN

## 2022-10-10 MED ORDER — HYDROMORPHONE HCL 1 MG/ML IJ SOLN
0.5000 mg | Freq: Once | INTRAMUSCULAR | Status: AC
  Administered 2022-10-10: 0.5 mg via INTRAVENOUS

## 2022-10-10 MED ORDER — EPINEPHRINE 1 MG/10ML IJ SOSY
PREFILLED_SYRINGE | INTRAMUSCULAR | Status: AC
  Filled 2022-10-10: qty 10

## 2022-10-10 MED ORDER — PROPOFOL 10 MG/ML IV BOLUS
INTRAVENOUS | Status: DC | PRN
Start: 2022-10-10 — End: 2022-10-10
  Administered 2022-10-10: 40 mg via INTRAVENOUS

## 2022-10-10 MED ORDER — PROPOFOL 500 MG/50ML IV EMUL
INTRAVENOUS | Status: DC | PRN
  Administered 2022-10-10: 150 ug/kg/min via INTRAVENOUS

## 2022-10-10 MED ORDER — HYDROMORPHONE HCL 1 MG/ML IJ SOLN
INTRAMUSCULAR | Status: AC
  Filled 2022-10-10: qty 1

## 2022-10-10 MED ORDER — LACOSAMIDE 50 MG PO TABS
150.0000 mg | ORAL_TABLET | Freq: Every day | ORAL | Status: DC
  Administered 2022-10-10 – 2022-10-11 (×2): 150 mg via ORAL
  Filled 2022-10-10 (×2): qty 3

## 2022-10-10 MED ORDER — LACOSAMIDE 50 MG PO TABS
100.0000 mg | ORAL_TABLET | Freq: Every day | ORAL | Status: DC
  Administered 2022-10-10 – 2022-10-12 (×3): 100 mg via ORAL
  Filled 2022-10-10 (×3): qty 2

## 2022-10-10 MED ORDER — FLEET ENEMA RE ENEM
1.0000 | ENEMA | Freq: Once | RECTAL | Status: AC
  Administered 2022-10-10: 1 via RECTAL
  Filled 2022-10-10: qty 1

## 2022-10-10 MED ORDER — ONDANSETRON HCL 4 MG PO TABS: 4.0000 mg | ORAL_TABLET | Freq: Four times a day (QID) | ORAL | Status: DC | PRN

## 2022-10-10 MED ORDER — FENTANYL CITRATE (PF) 100 MCG/2ML IJ SOLN
INTRAMUSCULAR | Status: DC | PRN
  Administered 2022-10-10 (×2): 50 ug via INTRAVENOUS

## 2022-10-10 MED ORDER — ACETAMINOPHEN 650 MG RE SUPP: 650.0000 mg | Freq: Four times a day (QID) | RECTAL | Status: DC | PRN

## 2022-10-10 MED ORDER — LIDOCAINE 2% (20 MG/ML) 5 ML SYRINGE
INTRAMUSCULAR | Status: DC | PRN
  Administered 2022-10-10: 100 mg via INTRAVENOUS

## 2022-10-10 MED ORDER — OXYCODONE-ACETAMINOPHEN 5-325 MG PO TABS
2.0000 | ORAL_TABLET | ORAL | Status: DC | PRN
  Administered 2022-10-11: 2 via ORAL
  Filled 2022-10-10: qty 2

## 2022-10-10 MED ORDER — ONDANSETRON HCL 4 MG/2ML IJ SOLN
INTRAMUSCULAR | Status: DC | PRN
Start: 2022-10-10 — End: 2022-10-10
  Administered 2022-10-10: 4 mg via INTRAVENOUS

## 2022-10-10 MED ORDER — MORPHINE SULFATE (PF) 2 MG/ML IV SOLN
2.0000 mg | INTRAVENOUS | Status: DC | PRN
  Administered 2022-10-10 – 2022-10-11 (×3): 2 mg via INTRAVENOUS
  Filled 2022-10-10 (×3): qty 1

## 2022-10-10 MED ORDER — SODIUM CHLORIDE 0.9 % IV SOLN: INTRAVENOUS | Status: DC

## 2022-10-10 MED ORDER — FENTANYL CITRATE (PF) 100 MCG/2ML IJ SOLN
INTRAMUSCULAR | Status: AC
  Filled 2022-10-10: qty 2

## 2022-10-10 MED ORDER — ACETAMINOPHEN 325 MG PO TABS: 650.0000 mg | ORAL_TABLET | Freq: Four times a day (QID) | ORAL | Status: DC | PRN

## 2022-10-10 NOTE — ED Notes (Signed)
..ED TO INPATIENT HANDOFF REPORT  ED Nurse Name and Phone #: [phone]  S Name/Age/Gender Jo [name] 47 y.o. female Room/Bed: WA12/WA12  Code Status   Code Status: Full Code  Home/SNF/Other Home Patient oriented to: self, place, time, and situation Is this baseline? Yes   Triage Complete: Triage complete  Chief Complaint Colonoscopy causing post-procedural bleeding [K91.840]  Triage Note Pt had an colonoscopy today where they removed some polyps and now she is still bleeding from that and feels weak.   Allergies No Known Allergies  Level of Care/Admitting Diagnosis ED Disposition     ED Disposition  Admit   Condition  --   Comment  Hospital Area: [facility] [[id number]]  Level of Care: Telemetry [5]  Admit to tele based on following criteria: Other see comments  Comments: GIB  May place patient in observation at [facility] or [facility] if equivalent level of care is available:: No  Covid Evaluation: Asymptomatic - no recent exposure (last 10 days) testing not required  Diagnosis: Colonoscopy causing post-procedural bleeding [[id number]]  Admitting Physician: Hillary Bow [[id number]]  Attending Physician: Hillary Bow [[id number]]          B Medical/Surgery History Past Medical History:  Diagnosis Date   Anemia    Fibroid    Pregnancy induced hypertension    Seizures (HCC)    Past Surgical History:  Procedure Laterality Date   CERVICAL POLYPECTOMY N/A 02/23/2019   Procedure: CERVICAL POLYPECTOMY;  Surgeon: Levi Aland, MD;  Location: [facility];  Service: Gynecology;  Laterality: N/A;   CESAREAN SECTION     CESAREAN SECTION  01/04/2012   Procedure: CESAREAN SECTION;  Surgeon: Mickel Baas, MD;  Location: [facility];  Service: Obstetrics;  Laterality: N/A;  Repeat cesarean section with delivery of baby boy at 1344. Apgars 9/9.   DILITATION & CURRETTAGE/HYSTROSCOPY WITH NOVASURE ABLATION N/A 02/23/2019    Procedure: DILATATION & CURETTAGE/HYSTEROSCOPY WITH NOVASURE ABLATION;  Surgeon: Levi Aland, MD;  Location[facility];  Service: Gynecology;  Laterality: N/A;  Possible HTA   TONSILLECTOMY       A IV Location/Drains/Wounds Patient Lines/Drains/Airways Status     Active Line/Drains/Airways     Name Placement date Placement time Site Days   Peripheral IV 10/10/22 20 G 1" Anterior;Proximal;Right Forearm 10/10/22  0030  Forearm  less than 1            Intake/Output Last 24 hours No intake or output data in the 24 hours ending 10/10/22 0709  Labs/Imaging Results for orders placed or performed during the hospital encounter of 10/09/22 (from the past 48 hour(s))  CBC with Differential     Status: Abnormal   Collection Time: 10/10/22 12:30 AM  Result Value Ref Range   WBC 12.3 (H) 4.0 - 10.5 K/uL   RBC 4.03 3.87 - 5.11 MIL/uL   Hemoglobin 12.0 12.0 - 15.0 g/dL   HCT 24.4 01.0 - 27.2 %   MCV 91.8 80.0 - 100.0 fL   MCH 29.8 26.0 - 34.0 pg   MCHC 32.4 30.0 - 36.0 g/dL   RDW 53.6 64.4 - 03.4 %   Platelets 203 150 - 400 K/uL   nRBC 0.0 0.0 - 0.2 %   Neutrophils Relative % 85 %   Neutro Abs 10.5 (H) 1.7 - 7.7 K/uL   Lymphocytes Relative 10 %   Lymphs Abs 1.3 0.7 - 4.0 K/uL   Monocytes Relative 3 %  Monocytes Absolute 0.4 0.1 - 1.0 K/uL   Eosinophils Relative 1 %   Eosinophils Absolute 0.1 0.0 - 0.5 K/uL   Basophils Relative 0 %   Basophils Absolute 0.0 0.0 - 0.1 K/uL   Immature Granulocytes 1 %   Abs Immature Granulocytes 0.06 0.00 - 0.07 K/uL    Comment: Performed at [facility], [address], [place], [place] [address]  Basic metabolic panel     Status: Abnormal   Collection Time: 10/10/22 12:30 AM  Result Value Ref Range   Sodium 133 (L) 135 - 145 mmol/L   Potassium 3.3 (L) 3.5 - 5.1 mmol/L   Chloride 103 98 - 111 mmol/L   CO2 21 (L) 22 - 32 mmol/L   Glucose, Bld 111 (H) 70 - 99 mg/dL    Comment: Glucose reference range  applies only to samples taken after fasting for at least 8 hours.   BUN 12 6 - 20 mg/dL   Creatinine, Ser 8.11 0.44 - 1.00 mg/dL   Calcium 8.8 (L) 8.9 - 10.3 mg/dL   GFR, Estimated >91 >47 mL/min    Comment: (NOTE) Calculated using the CKD-EPI Creatinine Equation (2021)    Anion gap 9 5 - 15    Comment: Performed at [facility], [address] [place], [place] [address]  Type and screen [facility]     Status: None   Collection Time: 10/10/22 12:30 AM  Result Value Ref Range   ABO/RH(D) O POS    Antibody Screen NEG    Sample Expiration      10/13/2022,2359 Performed at [facility], [address], [place], [place] [address]    No results found.  Pending Labs Unresulted Labs (From admission, onward)     Start     Ordered   10/10/22 0222  HIV Antibody (routine testing w rflx)  (HIV Antibody (Routine testing w reflex) panel)  Once,   R        10/10/22 0222            Vitals/Pain Today's Vitals   10/10/22 0530 10/10/22 0600 10/10/22 0630 10/10/22 0700  BP: 97/77 104/72 104/84 101/80  Pulse: 83 81 72 81  Resp: 16 15 15 15   Temp:      TempSrc:      SpO2: 98% 96% 97% 98%  Weight:      Height:      PainSc:        Isolation Precautions No active isolations  Medications Medications  lactated ringers infusion ( Intravenous New Bag/Given 10/10/22 0315)  acetaminophen (TYLENOL) tablet 650 mg (has no administration in time range)    Or  acetaminophen (TYLENOL) suppository 650 mg (has no administration in time range)  ondansetron (ZOFRAN) tablet 4 mg (has no administration in time range)    Or  ondansetron (ZOFRAN) injection 4 mg (has no administration in time range)  lacosamide (VIMPAT) tablet 150 mg (has no administration in time range)  lacosamide (VIMPAT) tablet 100 mg (has no administration in time range)  sodium chloride 0.9 % bolus 1,000 mL (0 mLs Intravenous Stopped 10/10/22 0233)    Mobility walks      Focused Assessments gastrointestional   R Recommendations: See Admitting Provider Note  Report given to:   Additional Notes: pt has not had any red stools while she has been here, A& o x4 with husband at bedside.

## 2022-10-10 NOTE — Consult Note (Signed)
Referring Provider: Dr. Mahala Menghini Primary Care Physician:  Orpha Bur, MD Primary Gastroenterologist:  Dr. Levora Angel  Reason for Consultation:  Rectal bleeding  HPI: Jo [name] is a 47 y.o. female status post colonoscopy on 10/09/22 with 3 colon polyps were removed sized 4 mm sessile in sigmoid colon, 8 mm sessile and descending colon, and 15 mm pedunculated polyp in rectosigmoid colon.  The largest colon polyp was removed with hot snare and the other 2 were removed with cold snare.  She did ok in the morning post-procedure but in the afternoon at 2 PM when she had acute onset of profuse red blood per rectum with clots that happened approximately 14 times up until 10:30 PM last night.  Has not had any further bleeding since that time and no bowel movement since 10:30 PM.  Husband shows a picture of large amount of bright red blood in toilet water with clots.  She reports passing out during her last episode of rectal bleeding prior to coming to the ER.  She had minimal left lower quadrant abdominal pain during this bleeding and states that she has had abdominal pain that preceded her colonoscopy.  Denies NSAIDs and not on anticoagulation.  Hemoglobin 12.0 on admit and 9.8 this morning.  Past Medical History:  Diagnosis Date   Anemia    Fibroid    Pregnancy induced hypertension    Seizures (HCC)     Past Surgical History:  Procedure Laterality Date   CERVICAL POLYPECTOMY N/A 02/23/2019   Procedure: CERVICAL POLYPECTOMY;  Surgeon: Levi Aland, MD;  Location: [facility];  Service: Gynecology;  Laterality: N/A;   CESAREAN SECTION     CESAREAN SECTION  01/04/2012   Procedure: CESAREAN SECTION;  Surgeon: Mickel Baas, MD;  Location: [facility];  Service: Obstetrics;  Laterality: N/A;  Repeat cesarean section with delivery of baby boy at 1344. Apgars 9/9.   DILITATION & CURRETTAGE/HYSTROSCOPY WITH NOVASURE ABLATION N/A 02/23/2019   Procedure: DILATATION &  CURETTAGE/HYSTEROSCOPY WITH NOVASURE ABLATION;  Surgeon: Levi Aland, MD;  Location: [facility];  Service: Gynecology;  Laterality: N/A;  Possible HTA   TONSILLECTOMY      Prior to Admission medications   Medication Sig Start Date End Date Taking? Authorizing Provider  lacosamide (VIMPAT) 50 MG TABS tablet Take 100-150 mg by mouth 2 (two) times daily. Take 2 tablets (100 mg) in the morning and Take 3 tablets (150 mg) at bedtime   Yes [provider]  diazepam (DIASTAT ACUDIAL) 20 MG GEL Place 20 mg rectally once as needed for up to 1 dose (seizure longer than 10 minutes). Patient not taking: Reported on 10/10/2022 03/05/20   Mesner, Barbara Cower, MD  HYDROcodone-acetaminophen (NORCO/VICODIN) 5-325 MG tablet Take 1-2 tablets by mouth every 4 (four) hours as needed. Patient not taking: Reported on 10/10/2022 09/12/22   Rolan Bucco, MD  ondansetron (ZOFRAN-ODT) 4 MG disintegrating tablet 4mg  ODT q4 hours prn nausea/vomit Patient not taking: Reported on 10/10/2022 09/12/22   Rolan Bucco, MD  tamsulosin (FLOMAX) 0.4 MG CAPS capsule Take 1 capsule (0.4 mg total) by mouth daily. Patient not taking: Reported on 10/10/2022 09/12/22   Rolan Bucco, MD    Scheduled Meds:  lacosamide  100 mg Oral Daily   lacosamide  150 mg Oral QHS   Continuous Infusions:  lactated ringers 75 mL/hr at 10/10/22 0315   PRN Meds:.acetaminophen **OR** acetaminophen, ondansetron **OR** ondansetron (ZOFRAN) IV  Allergies as of 10/09/2022   (No Known Allergies)  Family History  Problem Relation Age of Onset   Diabetes Father    Heart disease Father    Hypertension Father    Diabetes Maternal Aunt    Hypertension Maternal Aunt    Hypertension Paternal Uncle    Cancer Maternal Grandmother        Colon   Diabetes Maternal Grandmother    Hypertension Paternal Grandmother    Heart disease Paternal Grandmother     Social History   Socioeconomic History   Marital status: Married     Spouse name: Not on file   Number of children: Not on file   Years of education: Not on file   Highest education level: Not on file  Occupational History   Not on file  Tobacco Use   Smoking status: Never   Smokeless tobacco: Never  Vaping Use   Vaping status: Never Used  Substance and Sexual Activity   Alcohol use: No   Drug use: Never   Sexual activity: Yes  Other Topics Concern   Not on file  Social History Narrative   Not on file   Social Determinants of Health   Financial Resource Strain: Not on file  Food Insecurity: No Food Insecurity (10/10/2022)   Hunger Vital Sign    Worried About Running Out of Food in the Last Year: Never true    Ran Out of Food in the Last Year: Never true  Transportation Needs: No Transportation Needs (10/10/2022)   PRAPARE - Administrator, Civil Service (Medical): No    Lack of Transportation (Non-Medical): No  Physical Activity: Not on file  Stress: Not on file  Social Connections: Not on file  Intimate Partner Violence: Not At Risk (10/10/2022)   Humiliation, Afraid, Rape, and Kick questionnaire    Fear of Current or Ex-Partner: No    Emotionally Abused: No    Physically Abused: No    Sexually Abused: No    Review of Systems: All negative except as stated above in HPI.  Physical Exam: Vital signs: Vitals:   10/10/22 0700 10/10/22 0812  BP: 101/80 125/82  Pulse: 81 92  Resp: 15 20  Temp: 98.2 F (36.8 C) 98.2 F (36.8 C)  SpO2: 98% 98%   Last BM Date : 10/09/22 General:   Lethargic, Well-developed, well-nourished, pleasant and cooperative in NAD Head: normocephalic, atraumatic Eyes: anicteric sclera ENT: oropharynx clear Neck: supple, nontender Lungs:  Clear throughout to auscultation.   No wheezes, crackles, or rhonchi. No acute distress. Heart:  Regular rate and rhythm; no murmurs, clicks, rubs,  or gallops. Abdomen: LLQ and suprapubic tenderness with guarding, soft, nondistended, +BS  Rectal:  Deferred Ext:  no edema  GI:  Lab Results: Recent Labs    10/10/22 0030 10/10/22 1031  WBC 12.3* 6.3  HGB 12.0 9.8*  HCT 37.0 30.7*  PLT 203 165   BMET Recent Labs    10/10/22 0030  NA 133*  K 3.3*  CL 103  CO2 21*  GLUCOSE 111*  BUN 12  CREATININE 0.88  CALCIUM 8.8*   LFT No results for input(s): "PROT", "ALBUMIN", "AST", "ALT", "ALKPHOS", "BILITOT", "BILIDIR", "IBILI" in the last 72 hours. PT/INR No results for input(s): "LABPROT", "INR" in the last 72 hours.   Studies/Results: No results found.  Impression/Plan: Post polypectomy bleed likely from the largest polyp site in the rectosigmoid colon where a 1.5 cm pedunculated polyp was hot snared.  It seems like the bleeding has stopped but she is increased risk for  further bleeding and will recommend a flexible sigmoidoscopy to further evaluate the site as well as the other 2 polypectomy sites.  Will give a fleets enema and do the flex sig with sedation this afternoon.  Supportive care.  NPO.  She is very worried about going home and having recurrence of this bleeding.   LOS: 0 days   [name]  10/10/2022, 11:55 AM  Questions please call [phone]

## 2022-10-10 NOTE — Anesthesia Postprocedure Evaluation (Signed)
Anesthesia Post Note  Patient: Jo [name]  Procedure(s) Performed: FLEXIBLE SIGMOIDOSCOPY SCLEROTHERAPY HEMOSTASIS CLIP PLACEMENT     Patient location during evaluation: PACU Anesthesia Type: MAC Level of consciousness: awake and alert and oriented Pain management: pain level controlled Vital Signs Assessment: post-procedure vital signs reviewed and stable Respiratory status: spontaneous breathing, nonlabored ventilation and respiratory function stable Cardiovascular status: stable and blood pressure returned to baseline Anesthetic complications: no Comments: BP elevated due to injection of epi.    No notable events documented.  Last Vitals:  Vitals:   10/10/22 1550 10/10/22 1551  BP: (!) 162/112 (!) 158/102  Pulse:  (!) 102  Resp:  (!) 24  Temp:    SpO2:  91%    Last Pain:  Vitals:   10/10/22 1425  TempSrc: Temporal  PainSc: 4                  , A.

## 2022-10-10 NOTE — Assessment & Plan Note (Deleted)
Cont home AEDs once verified.

## 2022-10-10 NOTE — H&P (Signed)
History and Physical    Patient: Jo [name] ZOX:[id number] DOB: 17-Apr-1975 DOA: 10/09/2022 DOS: the patient was seen and examined on 10/10/2022 PCP: Orpha Bur, MD  Patient coming from: Home  Chief Complaint:  Chief Complaint  Patient presents with   Rectal Bleeding   HPI: Jo [name] is a 47 y.o. female with medical history significant of seizure disorder on AED, salivary gland CA in remission following surgical resection in 2018.  Pt had colonoscopy today with [facility], 2 polyps removed.  Since then has had BRBPR with clots.  Pt in to ED with light headedness and persistent BRBPR.  Review of Systems: As mentioned in the history of present illness. All other systems reviewed and are negative. Past Medical History:  Diagnosis Date   Anemia    Fibroid    Pregnancy induced hypertension    Seizures (HCC)    Past Surgical History:  Procedure Laterality Date   CERVICAL POLYPECTOMY N/A 02/23/2019   Procedure: CERVICAL POLYPECTOMY;  Surgeon: Levi Aland, MD;  Location: [facility];  Service: Gynecology;  Laterality: N/A;   CESAREAN SECTION     CESAREAN SECTION  01/04/2012   Procedure: CESAREAN SECTION;  Surgeon: Mickel Baas, MD;  Location: [facility];  Service: Obstetrics;  Laterality: N/A;  Repeat cesarean section with delivery of baby boy at 1344. Apgars 9/9.   DILITATION & CURRETTAGE/HYSTROSCOPY WITH NOVASURE ABLATION N/A 02/23/2019   Procedure: DILATATION & CURETTAGE/HYSTEROSCOPY WITH NOVASURE ABLATION;  Surgeon: Levi Aland, MD;  Location: [facility];  Service: Gynecology;  Laterality: N/A;  Possible HTA   TONSILLECTOMY     Social History:  reports that she has never smoked. She has never used smokeless tobacco. She reports that she does not drink alcohol and does not use drugs.  No Known Allergies  Family History  Problem Relation Age of Onset   Diabetes Father    Heart disease Father    Hypertension  Father    Diabetes Maternal Aunt    Hypertension Maternal Aunt    Hypertension Paternal Uncle    Cancer Maternal Grandmother        Colon   Diabetes Maternal Grandmother    Hypertension Paternal Grandmother    Heart disease Paternal Grandmother     Prior to Admission medications   Medication Sig Start Date End Date Taking? Authorizing Provider  lacosamide (VIMPAT) 50 MG TABS tablet Take 100-150 mg by mouth 2 (two) times daily. Take 2 tablets (100 mg) in the morning and Take 3 tablets (150 mg) at bedtime   Yes [provider]  diazepam (DIASTAT ACUDIAL) 20 MG GEL Place 20 mg rectally once as needed for up to 1 dose (seizure longer than 10 minutes). Patient not taking: Reported on 10/10/2022 03/05/20   Mesner, Barbara Cower, MD  HYDROcodone-acetaminophen (NORCO/VICODIN) 5-325 MG tablet Take 1-2 tablets by mouth every 4 (four) hours as needed. Patient not taking: Reported on 10/10/2022 09/12/22   Rolan Bucco, MD  ondansetron (ZOFRAN-ODT) 4 MG disintegrating tablet 4mg  ODT q4 hours prn nausea/vomit Patient not taking: Reported on 10/10/2022 09/12/22   Rolan Bucco, MD  tamsulosin (FLOMAX) 0.4 MG CAPS capsule Take 1 capsule (0.4 mg total) by mouth daily. Patient not taking: Reported on 10/10/2022 09/12/22   Rolan Bucco, MD    Physical Exam: Vitals:   10/10/22 0000 10/10/22 0030 10/10/22 0100 10/10/22 0215  BP: (!) 118/94 91/73 119/88   Pulse: 96 (!) 107 86 86  Resp: 15 (!) 22 (!)  21 16  Temp:      TempSrc:      SpO2: 98% 99% 98% 98%  Weight:      Height:       Constitutional: NAD, calm, comfortable Respiratory: clear to auscultation bilaterally, no wheezing, no crackles. Normal respiratory effort. No accessory muscle use.  Cardiovascular: Regular rate and rhythm, no murmurs / rubs / gallops. No extremity edema. 2+ pedal pulses. No carotid bruits.  Abdomen: no tenderness, no masses palpated. No hepatosplenomegaly. Bowel sounds positive.  Neurologic: CN 2-12 grossly intact. Sensation  intact, DTR normal. Strength 5/5 in all 4.  Psychiatric: Normal judgment and insight. Alert and oriented x 3. Normal mood.   Data Reviewed:    Labs on Admission: I have personally reviewed following labs and imaging studies  CBC: Recent Labs  Lab 10/10/22 0030  WBC 12.3*  NEUTROABS 10.5*  HGB 12.0  HCT 37.0  MCV 91.8  PLT 203   Basic Metabolic Panel: Recent Labs  Lab 10/10/22 0030  NA 133*  K 3.3*  CL 103  CO2 21*  GLUCOSE 111*  BUN 12  CREATININE 0.88  CALCIUM 8.8*   GFR: Estimated Creatinine Clearance: 67.7 mL/min (by C-G formula based on SCr of 0.88 mg/dL). Liver Function Tests: No results for input(s): "AST", "ALT", "ALKPHOS", "BILITOT", "PROT", "ALBUMIN" in the last 168 hours. No results for input(s): "LIPASE", "AMYLASE" in the last 168 hours. No results for input(s): "AMMONIA" in the last 168 hours. Coagulation Profile: No results for input(s): "INR", "PROTIME" in the last 168 hours. Cardiac Enzymes: No results for input(s): "CKTOTAL", "CKMB", "CKMBINDEX", "TROPONINI" in the last 168 hours. BNP (last 3 results) No results for input(s): "PROBNP" in the last 8760 hours. HbA1C: No results for input(s): "HGBA1C" in the last 72 hours. CBG: No results for input(s): "GLUCAP" in the last 168 hours. Lipid Profile: No results for input(s): "CHOL", "HDL", "LDLCALC", "TRIG", "CHOLHDL", "LDLDIRECT" in the last 72 hours. Thyroid Function Tests: No results for input(s): "TSH", "T4TOTAL", "FREET4", "T3FREE", "THYROIDAB" in the last 72 hours. Anemia Panel: No results for input(s): "VITAMINB12", "FOLATE", "FERRITIN", "TIBC", "IRON", "RETICCTPCT" in the last 72 hours. Urine analysis:    Component Value Date/Time   COLORURINE YELLOW 09/12/2022 1912   APPEARANCEUR CLEAR 09/12/2022 1912   LABSPEC >=1.030 09/12/2022 1912   PHURINE 6.5 09/12/2022 1912   GLUCOSEU NEGATIVE 09/12/2022 1912   HGBUR MODERATE (A) 09/12/2022 1912   BILIRUBINUR NEGATIVE 09/12/2022 1912    KETONESUR 15 (A) 09/12/2022 1912   PROTEINUR NEGATIVE 09/12/2022 1912   UROBILINOGEN 0.2 04/26/2011 1533   NITRITE NEGATIVE 09/12/2022 1912   LEUKOCYTESUR NEGATIVE 09/12/2022 1912    Radiological Exams on Admission: No results found.  EKG: Independently reviewed.   Assessment and Plan: * Colonoscopy causing post-procedural bleeding BRBPR with clots following colonoscopy with polypectomy earlier today. HGB 12 down from 15.5 last month. IVF Tele monitor Clear liquid diet Type and screen EDP d/w Dr. 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  Focal epilepsy with impairment of consciousness (HCC) Per pt, takes Lacosamide 150mg  at bedtime and 100mg  in AM Will continue this.      Advance Care Planning:   Code Status: Full Code  Consults: EDP sent message to Dr. Bosie Clos  Family Communication: Husband at bedside  Severity of Illness: The appropriate patient status for this patient is OBSERVATION. Observation status is judged to be reasonable and necessary in order to provide the required intensity of service  to ensure the patient's safety. The patient's presenting symptoms, physical exam findings, and initial radiographic and laboratory data in the context of their medical condition is felt to place them at decreased risk for further clinical deterioration. Furthermore, it is anticipated that the patient will be medically stable for discharge from the hospital within 2 midnights of admission.   Author: Hillary Bow., DO 10/10/2022 3:13 AM  For on call review [email or web address].

## 2022-10-10 NOTE — Op Note (Signed)
[facility] Patient Name: Jo [name] Procedure Date: 10/10/2022 MRN: [id number] Attending MD: [name] , MD, [id number] Date of Birth: 07/02/1975 CSN: [id number] Age: 47 Admit Type: Inpatient Procedure:                Flexible Sigmoidoscopy Indications:              Hematochezia Providers:                [name], MD, Adin Hector, RN, Lorenza Evangelist, RN, Rozetta Nunnery, Technician Referring MD:             hospital team Medicines:                Propofol per Anesthesia, Monitored Anesthesia Care Complications:            No immediate complications. Estimated Blood Loss:     Estimated blood loss was minimal. Procedure:                Pre-Anesthesia Assessment:                           - Prior to the procedure, a History and Physical                            was performed, and patient medications and                            allergies were reviewed. The patient's tolerance of                            previous anesthesia was also reviewed. The risks                            and benefits of the procedure and the sedation                            options and risks were discussed with the patient.                            All questions were answered, and informed consent                            was obtained. Prior Anticoagulants: The patient has                            taken no anticoagulant or antiplatelet agents. ASA                            Grade Assessment: II - A patient with mild systemic                            disease. After reviewing the risks and benefits,  the patient was deemed in satisfactory condition to                            undergo the procedure.                           After obtaining informed consent, the scope was                            passed under direct vision. The PCF-HQ190L                            ([id number]) Olympus colonoscope was  introduced                            through the anus and advanced to the the left                            transverse colon. The flexible sigmoidoscopy was                            performed with difficulty due to excessive                            bleeding. Successful completion of the procedure                            was aided by controlling the bleeding and                            performing the maneuvers documented (below) in this                            report. The patient tolerated the procedure well.                            The quality of the bowel preparation was fair but                            repeated irrigation led to a good and adequate prep. Scope In: Scope Out: Findings:      The perianal and digital rectal examinations were normal.      Red blood was found in the rectum, in the recto-sigmoid colon and in the       sigmoid colon.      Nonbleeding ulcerated mucosa with stigmata of recent bleeding were       present in the mid sigmoid colon. Area was successfully injected with 6       mL of a 0.1 mg/mL solution of epinephrine for hemostasis. For       hemostasis, three hemostatic clips were successfully placed. There was       no bleeding at the end of the procedure. Estimated blood loss was       minimal.      A single (solitary) five mm ulcer was found in the distal sigmoid colon.  Oozing was present. Stigmata of recent bleeding were present. Area was       successfully injected with 6 mL of a 0.1 mg/mL solution of epinephrine       for hemostasis. For hemostasis, two hemostatic clips were successfully       placed. There was no bleeding at the end of the procedure.      Internal hemorrhoids were found during retroflexion. The hemorrhoids       were small and Grade I (internal hemorrhoids that do not prolapse). Impression:               - Blood in the rectum, in the recto-sigmoid colon                            and in the sigmoid colon.                            - Mucosal ulceration in the mid sigmoid colon.                            Injected. Clips were placed.                           - A single (solitary) ulcer in the distal sigmoid                            colon. Injected. Clips were placed.                           - Internal hemorrhoids.                           - No specimens collected. Moderate Sedation:      N/A - MAC procedure Recommendation:           - Clear liquid diet. Procedure Code(s):        --- Professional ---                           [phone], Sigmoidoscopy, flexible; with control of                            bleeding, any method Diagnosis Code(s):        --- Professional ---                           K92.1, Melena (includes Hematochezia)                           K92.2, Gastrointestinal hemorrhage, unspecified                           K62.5, Hemorrhage of anus and rectum                           K63.3, Ulcer of intestine                           K64.0,  First degree hemorrhoids CPT copyright 2022 American [employer] Association. All rights reserved. The codes documented in this report are preliminary and upon coder review may  be revised to meet current compliance requirements. [name], MD 10/10/2022 3:44:07 PM This report has been signed electronically. Number of Addenda: 0

## 2022-10-10 NOTE — Anesthesia Preprocedure Evaluation (Addendum)
Anesthesia Evaluation  Patient identified by MRN, date of birth, ID band Patient awake    Reviewed: Allergy & Precautions, H&P , NPO status , Patient's Chart, lab work & pertinent test results  Airway Mallampati: II  TM Distance: >3 FB Neck ROM: full    Dental no notable dental hx. (+) Dental Advisory Given, Teeth Intact   Pulmonary neg pulmonary ROS   Pulmonary exam normal breath sounds clear to auscultation       Cardiovascular hypertension, negative cardio ROS  Rhythm:regular Rate:Normal     Neuro/Psych Seizures -, Well Controlled,  Seizures usually well controlled per patient, however last one about 1.5weeks ago (prior to that, had been years)- absence seizures w/ blank staring, some tongue biting and post-ictal state. Did bite tongue with last seizure but has healed well. No prescribed rescue medication for seizures; typically terminate on their own.  Took antiepileptic last night as scheduled  negative psych ROS   GI/Hepatic Neg liver ROS,,,Rectal bleeding   Endo/Other  negative endocrine ROS    Renal/GU negative Renal ROS     Musculoskeletal negative musculoskeletal ROS (+)    Abdominal Normal abdominal exam  (+)   Peds  Hematology  (+) Blood dyscrasia, anemia hx iron deficiency anemia   Anesthesia Other Findings Rectal Bleeding  Reproductive/Obstetrics negative OB ROS Hcg negative                              Anesthesia Physical Anesthesia Plan  ASA: 2  Anesthesia Plan: MAC   Post-op Pain Management:    Induction: Intravenous  PONV Risk Score and Plan: 3 and Propofol infusion  Airway Management Planned: Natural Airway and Simple Face Mask  Additional Equipment: None  Intra-op Plan:   Post-operative Plan:   Informed Consent: I have reviewed the patients History and Physical, chart, labs and discussed the procedure including the risks, benefits and alternatives for  the proposed anesthesia with the patient or authorized representative who has indicated his/her understanding and acceptance.     Dental advisory given  Plan Discussed with: CRNA and Anesthesiologist  Anesthesia Plan Comments:          Anesthesia Quick Evaluation

## 2022-10-10 NOTE — Assessment & Plan Note (Signed)
BRBPR with clots following colonoscopy with polypectomy earlier today. HGB 12 down from 15.5 last month. IVF Tele monitor Clear liquid diet Type and screen EDP d/w Dr. Bosie Clos

## 2022-10-10 NOTE — H&P (View-Only) (Signed)
Referring Provider: Dr. Mahala Menghini Primary Care Physician:  Orpha Bur, MD Primary Gastroenterologist:  Dr. Levora Angel  Reason for Consultation:  Rectal bleeding  HPI: Jo [name] is a 47 y.o. female status post colonoscopy on 10/09/22 with 3 colon polyps were removed sized 4 mm sessile in sigmoid colon, 8 mm sessile and descending colon, and 15 mm pedunculated polyp in rectosigmoid colon.  The largest colon polyp was removed with hot snare and the other 2 were removed with cold snare.  She did ok in the morning post-procedure but in the afternoon at 2 PM when she had acute onset of profuse red blood per rectum with clots that happened approximately 14 times up until 10:30 PM last night.  Has not had any further bleeding since that time and no bowel movement since 10:30 PM.  Husband shows a picture of large amount of bright red blood in toilet water with clots.  She reports passing out during her last episode of rectal bleeding prior to coming to the ER.  She had minimal left lower quadrant abdominal pain during this bleeding and states that she has had abdominal pain that preceded her colonoscopy.  Denies NSAIDs and not on anticoagulation.  Hemoglobin 12.0 on admit and 9.8 this morning.  Past Medical History:  Diagnosis Date   Anemia    Fibroid    Pregnancy induced hypertension    Seizures (HCC)     Past Surgical History:  Procedure Laterality Date   CERVICAL POLYPECTOMY N/A 02/23/2019   Procedure: CERVICAL POLYPECTOMY;  Surgeon: Levi Aland, MD;  Location: [facility];  Service: Gynecology;  Laterality: N/A;   CESAREAN SECTION     CESAREAN SECTION  01/04/2012   Procedure: CESAREAN SECTION;  Surgeon: Mickel Baas, MD;  Location: [facility];  Service: Obstetrics;  Laterality: N/A;  Repeat cesarean section with delivery of baby boy at 1344. Apgars 9/9.   DILITATION & CURRETTAGE/HYSTROSCOPY WITH NOVASURE ABLATION N/A 02/23/2019   Procedure: DILATATION &  CURETTAGE/HYSTEROSCOPY WITH NOVASURE ABLATION;  Surgeon: Levi Aland, MD;  Location: [facility];  Service: Gynecology;  Laterality: N/A;  Possible HTA   TONSILLECTOMY      Prior to Admission medications   Medication Sig Start Date End Date Taking? Authorizing Provider  lacosamide (VIMPAT) 50 MG TABS tablet Take 100-150 mg by mouth 2 (two) times daily. Take 2 tablets (100 mg) in the morning and Take 3 tablets (150 mg) at bedtime   Yes [provider]  diazepam (DIASTAT ACUDIAL) 20 MG GEL Place 20 mg rectally once as needed for up to 1 dose (seizure longer than 10 minutes). Patient not taking: Reported on 10/10/2022 03/05/20   Mesner, Barbara Cower, MD  HYDROcodone-acetaminophen (NORCO/VICODIN) 5-325 MG tablet Take 1-2 tablets by mouth every 4 (four) hours as needed. Patient not taking: Reported on 10/10/2022 09/12/22   Rolan Bucco, MD  ondansetron (ZOFRAN-ODT) 4 MG disintegrating tablet 4mg  ODT q4 hours prn nausea/vomit Patient not taking: Reported on 10/10/2022 09/12/22   Rolan Bucco, MD  tamsulosin (FLOMAX) 0.4 MG CAPS capsule Take 1 capsule (0.4 mg total) by mouth daily. Patient not taking: Reported on 10/10/2022 09/12/22   Rolan Bucco, MD    Scheduled Meds:  lacosamide  100 mg Oral Daily   lacosamide  150 mg Oral QHS   Continuous Infusions:  lactated ringers 75 mL/hr at 10/10/22 0315   PRN Meds:.acetaminophen **OR** acetaminophen, ondansetron **OR** ondansetron (ZOFRAN) IV  Allergies as of 10/09/2022   (No Known Allergies)  Family History  Problem Relation Age of Onset   Diabetes Father    Heart disease Father    Hypertension Father    Diabetes Maternal Aunt    Hypertension Maternal Aunt    Hypertension Paternal Uncle    Cancer Maternal Grandmother        Colon   Diabetes Maternal Grandmother    Hypertension Paternal Grandmother    Heart disease Paternal Grandmother     Social History   Socioeconomic History   Marital status: Married     Spouse name: Not on file   Number of children: Not on file   Years of education: Not on file   Highest education level: Not on file  Occupational History   Not on file  Tobacco Use   Smoking status: Never   Smokeless tobacco: Never  Vaping Use   Vaping status: Never Used  Substance and Sexual Activity   Alcohol use: No   Drug use: Never   Sexual activity: Yes  Other Topics Concern   Not on file  Social History Narrative   Not on file   Social Determinants of Health   Financial Resource Strain: Not on file  Food Insecurity: No Food Insecurity (10/10/2022)   Hunger Vital Sign    Worried About Running Out of Food in the Last Year: Never true    Ran Out of Food in the Last Year: Never true  Transportation Needs: No Transportation Needs (10/10/2022)   PRAPARE - Administrator, Civil Service (Medical): No    Lack of Transportation (Non-Medical): No  Physical Activity: Not on file  Stress: Not on file  Social Connections: Not on file  Intimate Partner Violence: Not At Risk (10/10/2022)   Humiliation, Afraid, Rape, and Kick questionnaire    Fear of Current or Ex-Partner: No    Emotionally Abused: No    Physically Abused: No    Sexually Abused: No    Review of Systems: All negative except as stated above in HPI.  Physical Exam: Vital signs: Vitals:   10/10/22 0700 10/10/22 0812  BP: 101/80 125/82  Pulse: 81 92  Resp: 15 20  Temp: 98.2 F (36.8 C) 98.2 F (36.8 C)  SpO2: 98% 98%   Last BM Date : 10/09/22 General:   Lethargic, Well-developed, well-nourished, pleasant and cooperative in NAD Head: normocephalic, atraumatic Eyes: anicteric sclera ENT: oropharynx clear Neck: supple, nontender Lungs:  Clear throughout to auscultation.   No wheezes, crackles, or rhonchi. No acute distress. Heart:  Regular rate and rhythm; no murmurs, clicks, rubs,  or gallops. Abdomen: LLQ and suprapubic tenderness with guarding, soft, nondistended, +BS  Rectal:  Deferred Ext:  no edema  GI:  Lab Results: Recent Labs    10/10/22 0030 10/10/22 1031  WBC 12.3* 6.3  HGB 12.0 9.8*  HCT 37.0 30.7*  PLT 203 165   BMET Recent Labs    10/10/22 0030  NA 133*  K 3.3*  CL 103  CO2 21*  GLUCOSE 111*  BUN 12  CREATININE 0.88  CALCIUM 8.8*   LFT No results for input(s): "PROT", "ALBUMIN", "AST", "ALT", "ALKPHOS", "BILITOT", "BILIDIR", "IBILI" in the last 72 hours. PT/INR No results for input(s): "LABPROT", "INR" in the last 72 hours.   Studies/Results: No results found.  Impression/Plan: Post polypectomy bleed likely from the largest polyp site in the rectosigmoid colon where a 1.5 cm pedunculated polyp was hot snared.  It seems like the bleeding has stopped but she is increased risk for  further bleeding and will recommend a flexible sigmoidoscopy to further evaluate the site as well as the other 2 polypectomy sites.  Will give a fleets enema and do the flex sig with sedation this afternoon.  Supportive care.  NPO.  She is very worried about going home and having recurrence of this bleeding.   LOS: 0 days   [name]  10/10/2022, 11:55 AM  Questions please call [phone]

## 2022-10-10 NOTE — Interval H&P Note (Signed)
History and Physical Interval Note:  10/10/2022 2:44 PM  Jo [name]  has presented today for surgery, with the diagnosis of Rectal Bleeding.  The various methods of treatment have been discussed with the patient and family. After consideration of risks, benefits and other options for treatment, the patient has consented to  Procedure(s): FLEXIBLE SIGMOIDOSCOPY (N/A) as a surgical intervention.  The patient's history has been reviewed, patient examined, no change in status, stable for surgery.  I have reviewed the patient's chart and labs.  Questions were answered to the patient's satisfaction.     [name]

## 2022-10-10 NOTE — Assessment & Plan Note (Addendum)
Per pt, takes Lacosamide 150mg  at bedtime and 100mg  in AM Will continue this.

## 2022-10-10 NOTE — Brief Op Note (Signed)
Two polypectomy sites noted with bleeding stigmata and distal sigmoid polypectomy site oozing slightly during the procedure. Hemostasis achieved. See procedure note for details. Keep NPO today except ice chips. Supportive care.

## 2022-10-10 NOTE — Plan of Care (Signed)
  Problem: Education: Goal: Knowledge of General Education information will improve Description Including pain rating scale, medication(s)/side effects and non-pharmacologic comfort measures Outcome: Progressing   

## 2022-10-10 NOTE — Transfer of Care (Signed)
Immediate Anesthesia Transfer of Care Note  Patient: Jo [name]  Procedure(s) Performed: FLEXIBLE SIGMOIDOSCOPY  Patient Location: PACU and Endoscopy Unit  Anesthesia Type:MAC  Level of Consciousness: awake, alert , and oriented  Airway & Oxygen Therapy: Patient Spontanous Breathing and Patient connected to face mask oxygen  Post-op Assessment: Report given to RN and Post -op Vital signs reviewed and stable  Post vital signs: Reviewed and stable  Last Vitals:  Vitals Value Taken Time  BP    Temp    Pulse    Resp    SpO2      Last Pain:  Vitals:   10/10/22 1425  TempSrc: Temporal  PainSc: 4       Patients Stated Pain Goal: 0 (10/10/22 1425)  Complications: No notable events documented.

## 2022-10-10 NOTE — Progress Notes (Signed)
Seen after midnight  47 year old white female prior C-sections back in 2013 2012 Seizure disorder/focal epilepsy on AEDs follows with [facility]-catamenial pattern-auras of "devil talking"-no further menstrual cycle since 2020 status post ablation Slavery gland CA status post resection 2018 (mandibulectomy wide resection retromolar trigone mucosa dental extraction left selective no dissection zones 1 through 2 and titanium mandibular reconstruction to reinforce left mandible)-last visit 06/12/2022 [facility] Dr. Jacalyn Lefevre   Underwent colonoscopy [facility] 8/14-2 polyps removed-multiple BRB frank blood with clot 12 episodes felt weak and dizzy Hemoglobin drifted down from 15-12 Dr. Bosie Clos of [facility] made aware  Scope done 8/15=mid sigmoid stigmata bleed--6 ml EPI injected--5 mm ulcer distal sigmoid as well + 2 hemostatic clips  Will get clear liquid diet  I saw the patient on arrival to floor Her pain as 8/10 Quiet voice, seems uncomfy  BP (!) 154/99 (BP Location: Right Arm)   Pulse 92   Temp 98.3 F (36.8 C) (Oral)   Resp 16   Ht 5\' 1"  (1.549 m)   Wt 62.6 kg   LMP  (Approximate)   SpO2 98%   BMI 26.08 kg/m  Eomi ncat no ict/pallor CTA b Slight tender to lower quad, no distension, abd scaphoid No LE edema  She had received Dilaudid/Fentanyl Zofran in [place] cu  We will start on liquid diet, Rx Percocet for moderate and IV morphine for severe pain  Updated husband fully @ bedside  No charge   Pleas Koch, MD [facility] 5:16 PM

## 2022-10-10 NOTE — ED Provider Notes (Signed)
[place] EMERGENCY DEPARTMENT AT [facility] Provider Note   CSN: [id number] Arrival date & time: 10/09/22  2302     History  Chief Complaint  Patient presents with   Rectal Bleeding    Casarah Garrick is a 47 y.o. female.  HPI     This is a 47 year old female who presents with rectal bleeding.  Patient reports that she had a colonoscopy today around 9:30 AM.  She had 2 polyps removed.  She states that she got home around 2 PM.  Since that time she has had multiple episodes of bloody bowel movements and frank blood with clot.  She reports over 12 episodes.  She generally feels weak and dizzy.  No abdominal pain or discomfort.  She called her GI doctor at [facility] and was referred to the ED.  Home Medications Prior to Admission medications   Medication Sig Start Date End Date Taking? Authorizing Provider  diazepam (DIASTAT ACUDIAL) 20 MG GEL Place 20 mg rectally once as needed for up to 1 dose (seizure longer than 10 minutes). 03/05/20   Mesner, Barbara Cower, MD  Ferrous Sulfate (IRON PO) Take by mouth.    [provider]  HYDROcodone-acetaminophen (NORCO/VICODIN) 5-325 MG tablet Take 1-2 tablets by mouth every 4 (four) hours as needed. 09/12/22   Rolan Bucco, MD  IBUPROFEN PO Take by mouth.    [provider]  ondansetron (ZOFRAN-ODT) 4 MG disintegrating tablet 4mg  ODT q4 hours prn nausea/vomit 09/12/22   Rolan Bucco, MD  tamsulosin (FLOMAX) 0.4 MG CAPS capsule Take 1 capsule (0.4 mg total) by mouth daily. 09/12/22   Rolan Bucco, MD  zonisamide (ZONEGRAN) 100 MG capsule Take 100 mg by mouth daily.    [provider]      Allergies    Patient has no known allergies.    Review of Systems   Review of Systems  Constitutional:  Negative for fever.  Respiratory:  Negative for shortness of breath.   Cardiovascular:  Negative for chest pain.  Gastrointestinal:  Positive for blood in stool. Negative for abdominal pain, nausea and  vomiting.  All other systems reviewed and are negative.   Physical Exam Updated Vital Signs BP 119/88   Pulse 86   Temp 98.6 F (37 C) (Oral)   Resp (!) 21   Ht 1.549 m (5\' 1" )   Wt 62.6 kg   LMP  (Approximate)   SpO2 98%   BMI 26.07 kg/m  Physical Exam Vitals and nursing note reviewed.  Constitutional:      Appearance: She is well-developed.  HENT:     Head: Normocephalic and atraumatic.  Eyes:     Pupils: Pupils are equal, round, and reactive to light.  Cardiovascular:     Rate and Rhythm: Normal rate and regular rhythm.     Heart sounds: Normal heart sounds.  Pulmonary:     Effort: Pulmonary effort is normal. No respiratory distress.     Breath sounds: No wheezing.  Abdominal:     Palpations: Abdomen is soft.  Genitourinary:    Comments: Deferred Musculoskeletal:     Cervical back: Neck supple.  Skin:    General: Skin is warm and dry.  Neurological:     Mental Status: She is alert and oriented to person, place, and time.  Psychiatric:        Mood and Affect: Mood normal.     ED Results / Procedures / Treatments   Labs (all labs ordered are listed, but  only abnormal results are displayed) Labs Reviewed  CBC WITH DIFFERENTIAL/PLATELET - Abnormal; Notable for the following components:      Result Value   WBC 12.3 (*)    Neutro Abs 10.5 (*)    All other components within normal limits  BASIC METABOLIC PANEL - Abnormal; Notable for the following components:   Sodium 133 (*)    Potassium 3.3 (*)    CO2 21 (*)    Glucose, Bld 111 (*)    Calcium 8.8 (*)    All other components within normal limits  TYPE AND SCREEN    EKG EKG Interpretation Date/Time:  Wednesday October 09 2022 23:17:01 EDT Ventricular Rate:  87 PR Interval:  151 QRS Duration:  72 QT Interval:  360 QTC Calculation: 433 R Axis:   21  Text Interpretation: Sinus rhythm Abnormal R-wave progression, early transition Confirmed by Ross Marcus ([id number]) on 10/10/2022 1:36:24  AM  Radiology No results found.  Procedures Procedures    Medications Ordered in ED Medications  sodium chloride 0.9 % bolus 1,000 mL (1,000 mLs Intravenous New Bag/Given 10/10/22 0045)    ED Course/ Medical Decision Making/ A&P Clinical Course as of 10/10/22 0153  Thu Oct 10, 2022  0152 Spoke with Dr. Bosie Clos, gastroenterology.  Will evaluate patient in the morning for possible repeat colonoscopy.  Plan for admission to the hospitalist. [CH]    Clinical Course User Index [CH] , Mayer Masker, MD                                 Medical Decision Making Amount and/or Complexity of Data Reviewed Labs: ordered.  Risk Decision regarding hospitalization.   This patient presents to the ED for concern of post polypectomy bleeding, this involves an extensive number of treatment options, and is a complaint that carries with it a high risk of complications and morbidity.  I considered the following differential and admission for this acute, potentially life threatening condition.  The differential diagnosis includes bleeding related to polypectomy, other complication  MDM:    This is a 47 year old female who presents with post polypectomy bleeding.  She is overall nontoxic and vital signs are reassuring.  Reports copious bloody stools at home.  Hemoglobin is 12.  Baseline hemoglobin closer to 15.  Patient was given a liter of fluids.  She was typed and screened.  Discussed with gastroenterology who recommends admission for observation and evaluation in the morning.  (Labs, imaging, consults)  Labs: I Ordered, and personally interpreted labs.  The pertinent results include: CBC, BMP, type and screen  Imaging Studies ordered: I ordered imaging studies including none I independently visualized and interpreted imaging. I agree with the radiologist interpretation  Additional history obtained from chart review.  External records from outside source obtained and reviewed including  colonoscopy information not available  Cardiac Monitoring: The patient was maintained on a cardiac monitor.  If on the cardiac monitor, I personally viewed and interpreted the cardiac monitored which showed an underlying rhythm of: Sinus rhythm  Reevaluation: After the interventions noted above, I reevaluated the patient and found that they have :stayed the same  Social Determinants of Health:  lives independently  Disposition: Admit  Co morbidities that complicate the patient evaluation  Past Medical History:  Diagnosis Date   Anemia    Fibroid    Pregnancy induced hypertension    Seizures (HCC)      Medicines Meds ordered this  encounter  Medications   sodium chloride 0.9 % bolus 1,000 mL    I have reviewed the patients home medicines and have made adjustments as needed  Problem List / ED Course: Problem List Items Addressed This Visit   None Visit Diagnoses     Colonoscopy causing post-procedural bleeding    -  Primary                   Final Clinical Impression(s) / ED Diagnoses Final diagnoses:  Colonoscopy causing post-procedural bleeding    Rx / DC Orders ED Discharge Orders     None         Shon Baton, MD 10/10/22 0155

## 2022-10-10 NOTE — Assessment & Plan Note (Signed)
Dx 2018 Sounds like long term remission following surgery.

## 2022-10-11 DIAGNOSIS — K91840 Postprocedural hemorrhage and hematoma of a digestive system organ or structure following a digestive system procedure: Secondary | ICD-10-CM | POA: Diagnosis not present

## 2022-10-11 LAB — CBC WITH DIFFERENTIAL/PLATELET
Abs Immature Granulocytes: 0.05 10*3/uL (ref 0.00–0.07)
Basophils Absolute: 0 10*3/uL (ref 0.0–0.1)
Basophils Relative: 0 %
Eosinophils Absolute: 0.1 10*3/uL (ref 0.0–0.5)
Eosinophils Relative: 2 %
HCT: 30.5 % — ABNORMAL LOW (ref 36.0–46.0)
Hemoglobin: 9.7 g/dL — ABNORMAL LOW (ref 12.0–15.0)
Immature Granulocytes: 1 %
Lymphocytes Relative: 25 %
Lymphs Abs: 1.9 10*3/uL (ref 0.7–4.0)
MCH: 30.1 pg (ref 26.0–34.0)
MCHC: 31.8 g/dL (ref 30.0–36.0)
MCV: 94.7 fL (ref 80.0–100.0)
Monocytes Absolute: 0.4 10*3/uL (ref 0.1–1.0)
Monocytes Relative: 5 %
Neutro Abs: 5.1 10*3/uL (ref 1.7–7.7)
Neutrophils Relative %: 67 %
Platelets: 165 10*3/uL (ref 150–400)
RBC: 3.22 MIL/uL — ABNORMAL LOW (ref 3.87–5.11)
RDW: 12.9 % (ref 11.5–15.5)
WBC: 7.6 10*3/uL (ref 4.0–10.5)
nRBC: 0 % (ref 0.0–0.2)

## 2022-10-11 LAB — BASIC METABOLIC PANEL
Anion gap: 9 (ref 5–15)
BUN: 6 mg/dL (ref 6–20)
CO2: 23 mmol/L (ref 22–32)
Calcium: 8.7 mg/dL — ABNORMAL LOW (ref 8.9–10.3)
Chloride: 102 mmol/L (ref 98–111)
Creatinine, Ser: 0.96 mg/dL (ref 0.44–1.00)
GFR, Estimated: >60 mL/min (ref >60)
Glucose, Bld: 85 mg/dL (ref 70–99)
Potassium: 3.2 mmol/L — ABNORMAL LOW (ref 3.5–5.1)
Sodium: 134 mmol/L — ABNORMAL LOW (ref 135–145)

## 2022-10-11 MED ORDER — PANTOPRAZOLE SODIUM 40 MG PO TBEC
40.0000 mg | DELAYED_RELEASE_TABLET | Freq: Every day | ORAL | Status: DC
  Administered 2022-10-11 – 2022-10-12 (×2): 40 mg via ORAL
  Filled 2022-10-11 (×2): qty 1

## 2022-10-11 NOTE — Progress Notes (Signed)
   10/11/22 1438  TOC Brief Assessment  Insurance and Status Reviewed  Patient has primary care physician Yes  Home environment has been reviewed yes  from home  Prior level of function: independent  Prior/Current Home Services No current home services  Social Determinants of Health Reivew SDOH reviewed no interventions necessary  Readmission risk has been reviewed Yes  Transition of care needs no transition of care needs at this time

## 2022-10-11 NOTE — Progress Notes (Signed)
HOSPITALIST ROUNDING NOTE Jo [name] NWG:[id number]  DOB: 1975-07-08  DOA: 10/09/2022  PCP: Orpha Bur, MD  10/11/2022,2:45 PM   LOS: 1 day      Code Status: Full   From: Home  current Dispo: Home     47 year old white female prior C-sections back in 2013 2012 Seizure disorder/focal epilepsy on AEDs follows with [facility]-catamenial pattern-auras of "devil talking"-no further menstrual cycle since 2020 status post ablation Saliva glang CA status post resection 2018 (mandibulectomy wide resection retromolar trigone mucosa dental extraction left selective no dissection zones 1 through 2 and titanium mandibular reconstruction to reinforce left mandible)-last visit 06/12/2022 [facility] Dr. Jacalyn Lefevre   Underwent colonoscopy [facility] 8/14-2 polyps removed-multiple BRB frank blood with clot 12 episodes felt weak and dizzy Hemoglobin drifted down from 15-12 Dr. Bosie Clos of [facility] made aware   Scope done 8/15=mid sigmoid stigmata bleed--6 ml EPI injected--5 mm ulcer distal sigmoid as well + 2 hemostatic clips  Plan  Post colonoscopy bleed status post interventions as above Hemoglobin is stabilized in the 9 range-repeat labs a.m. Add Protonix 40 daily Likely can saline lock in the morning cut back to 50 cc/8  Seizure disorder focal epilepsy on AEDs with catamenial pattern Continue lacosamide 100 daily, 150 at bedtime  Salivary gland cancer Outpatient further follow-up  Chronic abdominal pain-status post multiple C-sections/endometrial ablation [facility] Have asked her to discuss this with Dr. Bosie Clos as an outpatient-probably low yield further interventions-has been told by GYN in the past that would need laparoscopy to search for adhesions which she is not willing to do as her pain is only 2/10 at baseline  Discussed with husband and multiple family members at the bedside 8/16  DVT prophylaxis:   SCD  Status is: Inpatient Remains inpatient appropriate because:  Requires  stabilization overnight    Subjective: Coherent awake no distress Darkish stool later today-bright red blood and stopped Eating and drinking some liquids has not been graduated yet  Objective + exam Vitals:   10/10/22 1712 10/10/22 2020 10/11/22 0449 10/11/22 1402  BP: (!) 154/99 (!) 137/98 (!) 123/90 125/87  Pulse: 92 91 96 (!) 105  Resp: 16 18 18    Temp: 98.3 F (36.8 C) 98.2 F (36.8 C) 98.2 F (36.8 C) 98 F (36.7 C)  TempSrc: Oral Oral Oral Oral  SpO2: 98% 97% 98% 99%  Weight:      Height:       Filed Weights   10/09/22 2323 10/10/22 1425  Weight: 62.6 kg 62.6 kg    Examination:  EOMI NCAT no focal deficit no icterus no pallor no rales rhonchi wheeze CTAB no added sound Abdomen soft no rebound mild lower quadrant discomfort No lower extremity edema   Data Reviewed: reviewed   CBC    Component Value Date/Time   WBC 7.6 10/11/2022 0526   RBC 3.22 (L) 10/11/2022 0526   HGB 9.7 (L) 10/11/2022 0526   HGB 8.8 (L) 04/17/2017 1522   HGB 10.9 (L) 10/16/2016 1450   HCT 30.5 (L) 10/11/2022 0526   HCT 35.8 10/16/2016 1450   PLT 165 10/11/2022 0526   PLT 279 04/17/2017 1522   PLT 295 10/16/2016 1450   MCV 94.7 10/11/2022 0526   MCV 73.4 (L) 10/16/2016 1450   MCH 30.1 10/11/2022 0526   MCHC 31.8 10/11/2022 0526   RDW 12.9 10/11/2022 0526   RDW 19.7 (H) 10/16/2016 1450   LYMPHSABS 1.9 10/11/2022 0526   LYMPHSABS 1.7 10/16/2016 1450  MONOABS 0.4 10/11/2022 0526   MONOABS 0.4 10/16/2016 1450   EOSABS 0.1 10/11/2022 0526   EOSABS 0.1 10/16/2016 1450   BASOSABS 0.0 10/11/2022 0526   BASOSABS 0.1 10/16/2016 1450      Latest Ref Rng & Units 10/11/2022    5:26 AM 10/10/2022   12:30 AM 09/12/2022    7:12 PM  CMP  Glucose 70 - 99 mg/dL 85  557  97   BUN 6 - 20 mg/dL 6  12  14    Creatinine 0.44 - 1.00 mg/dL 3.22  0.25  4.27   Sodium 135 - 145 mmol/L 134  133  136   Potassium 3.5 - 5.1 mmol/L 3.2  3.3  3.8   Chloride 98 - 111 mmol/L 102  103  103   CO2 22 - 32  mmol/L 23  21  22    Calcium 8.9 - 10.3 mg/dL 8.7  8.8  9.6   Total Protein 6.5 - 8.1 g/dL   7.9   Total Bilirubin 0.3 - 1.2 mg/dL   0.8   Alkaline Phos 38 - 126 U/L   62   AST 15 - 41 U/L   23   ALT 0 - 44 U/L   17      Scheduled Meds:  lacosamide  100 mg Oral Daily   lacosamide  150 mg Oral QHS   Continuous Infusions:  lactated ringers 75 mL/hr at 10/11/22 0456    Time 27  Rhetta Mura, MD  [facility]

## 2022-10-11 NOTE — Plan of Care (Signed)

## 2022-10-11 NOTE — Progress Notes (Addendum)
[facility]ology Progress Note  Jo [name] 47 y.o. November 30, 1975   Subjective: Feeling better. Reports small amount of blood with soft brown stool this afternoon. Abdominal pain minimal.  Objective: Vital signs: Vitals:   10/11/22 0449 10/11/22 1402  BP: (!) 123/90 125/87  Pulse: 96 (!) 105  Resp: 18   Temp: 98.2 F (36.8 C) 98 F (36.7 C)  SpO2: 98% 99%    Physical Exam: Gen: alert, no acute distress, well-nourished, pleasant HEENT: anicteric sclera CV: RRR Chest: CTA B Abd: minimal LLQ tenderness with guarding, soft, nondistended, +BS Ext: no edema  Lab Results: Recent Labs    10/10/22 0030 10/11/22 0526  NA 133* 134*  K 3.3* 3.2*  CL 103 102  CO2 21* 23  GLUCOSE 111* 85  BUN 12 6  CREATININE 0.88 0.96  CALCIUM 8.8* 8.7*   No results for input(s): "AST", "ALT", "ALKPHOS", "BILITOT", "PROT", "ALBUMIN" in the last 72 hours. Recent Labs    10/10/22 0030 10/10/22 1031 10/11/22 0526  WBC 12.3* 6.3 7.6  NEUTROABS 10.5*  --  5.1  HGB 12.0 9.8* 9.7*  HCT 37.0 30.7* 30.5*  MCV 91.8 94.2 94.7  PLT 203 165 165      Assessment/Plan: Post-polypectomy bleed - s/p hemoclipping and epi dilution injection of 2 polypectomy sites. Hgb stable at 9.7. Suspect blood in stool this afternoon is residual blood. Advance diet. Home tomorrow if stable. Discussed sigmoidoscopy findings again and answered all of her questions. She requests outpt transfer of GI care to me and will have my medical assistant arrange. Pathology back from her 10/11/22 colon and rectosigmoid polyp is a tubular adenoma (largest one) and other 2 polyps are hyperplastic. Will repeat her colonoscopy in 2 years. Informed patient by phone of pathology results. Dr. Levora [name] will f/u tomorrow.   Jo [name] 10/11/2022, 2:36 PM  Questions please call [phone] ID: Jo [name], female   DOB: September 27, 1975, 47 y.o.   MRN: [id number]

## 2022-10-12 DIAGNOSIS — K91840 Postprocedural hemorrhage and hematoma of a digestive system organ or structure following a digestive system procedure: Secondary | ICD-10-CM | POA: Diagnosis not present

## 2022-10-12 LAB — CBC WITH DIFFERENTIAL/PLATELET
Abs Immature Granulocytes: 0.01 10*3/uL (ref 0.00–0.07)
Basophils Absolute: 0 10*3/uL (ref 0.0–0.1)
Basophils Relative: 1 %
Eosinophils Absolute: 0.2 10*3/uL (ref 0.0–0.5)
Eosinophils Relative: 5 %
HCT: 29.6 % — ABNORMAL LOW (ref 36.0–46.0)
Hemoglobin: 9.5 g/dL — ABNORMAL LOW (ref 12.0–15.0)
Immature Granulocytes: 0 %
Lymphocytes Relative: 39 %
Lymphs Abs: 1.8 10*3/uL (ref 0.7–4.0)
MCH: 30.4 pg (ref 26.0–34.0)
MCHC: 32.1 g/dL (ref 30.0–36.0)
MCV: 94.6 fL (ref 80.0–100.0)
Monocytes Absolute: 0.3 10*3/uL (ref 0.1–1.0)
Monocytes Relative: 6 %
Neutro Abs: 2.2 10*3/uL (ref 1.7–7.7)
Neutrophils Relative %: 49 %
Platelets: 174 10*3/uL (ref 150–400)
RBC: 3.13 MIL/uL — ABNORMAL LOW (ref 3.87–5.11)
RDW: 13.1 % (ref 11.5–15.5)
WBC: 4.6 10*3/uL (ref 4.0–10.5)
nRBC: 0 % (ref 0.0–0.2)

## 2022-10-12 MED ORDER — IRON (FERROUS SULFATE) 325 (65 FE) MG PO TABS: 1.0000 | ORAL_TABLET | Freq: Two times a day (BID) | ORAL | 3 refills | Status: AC

## 2022-10-12 NOTE — Progress Notes (Signed)
Discharge instructions reviewed with patient and her husband at bedside, verbalized understanding. IV and telemetry removed. To be transported to private vehicle via wheelchair.

## 2022-10-12 NOTE — Discharge Summary (Signed)
Physician Discharge Summary  Jo [name] ZOX:[id number] DOB: [birth year] DOA: 10/09/2022  PCP: Orpha Bur, MD  Admit date: 10/09/2022 Discharge date: 10/12/2022  Time spent: 45 minutes  Recommendations for Outpatient Follow-up:  Cmet/cbc in 2-3 weeks Start iron as below--needs tsat and iron level in 1 week  Discharge Diagnoses:  MAIN problem for hospitalization   Post-polypectomy bleeding Anemia of acute blood loss Chronic abdominal pain?  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  Please see below for itemized issues addressed in HOpsital- refer to other progress notes for clarity if needed  Discharge Condition: Improved  Diet recommendation: Regular  Filed Weights   10/09/22 2323 10/10/22 1425  Weight: 62.6 kg 62.6 kg    History of present illness:  47 year old blk female prior C-sections back in 2013 2012 Seizure disorder/focal epilepsy on AEDs follows with [facility]-catamenial pattern-auras of "devil talking"-no further menstrual cycle since 2020 status post ablation Saliva glang CA status post resection 2018 (mandibulectomy wide resection retromolar trigone mucosa dental extraction left selective no dissection zones 1 through 2 and titanium mandibular reconstruction to reinforce left mandible)-last visit 06/12/2022 [facility] Dr. Jacalyn Lefevre   Underwent colonoscopy [facility] 8/14-2 polyps removed-multiple BRB frank blood with clot 12 episodes felt weak and dizzy Hemoglobin drifted down from 15-12 Dr. Bosie Clos of [facility] made aware   Scope done 8/15=mid sigmoid stigmata bleed--6 ml EPI injected--5 mm ulcer distal sigmoid as well + 2 hemostatic clips    Hospital Course:  Post colonoscopy bleed status post interventions as above Hemoglobin durably stabilized in the 9 range and was able to discharge home without any further needs of intervention and I discussed the case with GI who  felt no need for PPI   Seizure disorder focal epilepsy on AEDs with catamenial pattern Continue lacosamide 100 daily, 150 at bedtime   Salivary gland cancer Outpatient further follow-up   Chronic abdominal pain-status post multiple C-sections/endometrial ablation [facility] Have asked her to discuss this with Dr. Bosie Clos as an outpatient-probably low yield further interventions-has been told by GYN in the past that would need laparoscopy to search for adhesions which she is not willing to do as her pain is only 2/10 at baseline  Salivary gland CA status postresection and mandibular reconstruction 2018 Outpatient surveillance  Discharge Exam: Vitals:   10/12/22 0726 10/12/22 1000  BP: 119/86 (!) 136/95  Pulse: 79 (!) 108  Resp:    Temp:    SpO2:      Subj on day of d/c   EOMI NCAT no focal deficit no icterus no pallor Neck soft supple Chest clear Abdomen soft No rebound or guarding ROM intact Neurologically intact   Discharge Instructions   Discharge Instructions     Diet - low sodium heart healthy   Complete by: As directed    Discharge instructions   Complete by: As directed    You were diagnosed with post polypectomy bleeding which is relatively uncommon-this was not avoidable unfortunately This is a known side effect of the procedure Your hemoglobin has stabilized and I would encourage you to follow-up in about a week to get your iron and hemoglobin checked As discussed I will prescribe oral iron tablets twice a day to start in about a week's time and make sure you take it with some form of citrus (likely lemon or orange as a fruit would be the best) you should be able to build up  your iron stores easily and mobilize well Please follow-up with your gastroenterologist and PCP in the near future  AVOID NONSTEROIDALS as this can potentiate bleeding  Best of luck   Increase activity slowly   Complete by: As directed       Allergies as of 10/12/2022   No  Known Allergies      Medication List     STOP taking these medications    diazepam 20 MG Gel Commonly known as: Diastat AcuDial   tamsulosin 0.4 MG Caps capsule Commonly known as: FLOMAX       TAKE these medications    HYDROcodone-acetaminophen 5-325 MG tablet Commonly known as: NORCO/VICODIN Take 1-2 tablets by mouth every 4 (four) hours as needed.   Iron (Ferrous Sulfate) 325 (65 Fe) MG Tabs Take 1 tablet by mouth in the morning and at bedtime. Start taking on: October 19, 2022   lacosamide 50 MG Tabs tablet Commonly known as: VIMPAT Take 100-150 mg by mouth 2 (two) times daily. Take 2 tablets (100 mg) in the morning and Take 3 tablets (150 mg) at bedtime   ondansetron 4 MG disintegrating tablet Commonly known as: ZOFRAN-ODT 4mg  ODT q4 hours prn nausea/vomit       No Known Allergies    The results of significant diagnostics from this hospitalization (including imaging, microbiology, ancillary and laboratory) are listed below for reference.    Significant Diagnostic Studies: CT Renal Stone Study  Result Date: 09/12/2022 CLINICAL DATA:  Abdominal/flank pain, stone suspected EXAM: CT ABDOMEN AND PELVIS WITHOUT CONTRAST TECHNIQUE: Multidetector CT imaging of the abdomen and pelvis was performed following the standard protocol without IV contrast. RADIATION DOSE REDUCTION: This exam was performed according to the departmental dose-optimization program which includes automated exposure control, adjustment of the mA and/or kV according to patient size and/or use of iterative reconstruction technique. COMPARISON:  CT abdomen and pelvis 04/28/2020 FINDINGS: Lower chest: No acute abnormality. Hepatobiliary: Unremarkable liver. Normal gallbladder. No biliary dilation. Pancreas: Unremarkable. Spleen: Unremarkable. Adrenals/Urinary Tract: Stable adrenal glands. Tiny nonobstructing calyceal stones in the left kidney. No left hydronephrosis. Moderate right hydroureteronephrosis  upstream from a 4 mm stone in the distal right ureter. Unremarkable bladder. Stomach/Bowel: Normal caliber large and small bowel. No bowel wall thickening. The appendix is normal.Stomach is within normal limits. Vascular/Lymphatic: No significant vascular findings are present. No enlarged abdominal or pelvic lymph nodes. Reproductive: Unremarkable. Other: No free intraperitoneal fluid or air. Musculoskeletal: No acute fracture. IMPRESSION: Moderate right hydroureteronephrosis upstream from a 4 mm stone in the distal right ureter. Electronically Signed   By: Minerva Fester M.D.   On: 09/12/2022 20:27    Microbiology: No results found for this or any previous visit (from the past 240 hour(s)).   Labs: Basic Metabolic Panel: Recent Labs  Lab 10/10/22 0030 10/11/22 0526  NA 133* 134*  K 3.3* 3.2*  CL 103 102  CO2 21* 23  GLUCOSE 111* 85  BUN 12 6  CREATININE 0.88 0.96  CALCIUM 8.8* 8.7*   Liver Function Tests: No results for input(s): "AST", "ALT", "ALKPHOS", "BILITOT", "PROT", "ALBUMIN" in the last 168 hours. No results for input(s): "LIPASE", "AMYLASE" in the last 168 hours. No results for input(s): "AMMONIA" in the last 168 hours. CBC: Recent Labs  Lab 10/10/22 0030 10/10/22 1031 10/11/22 0526 10/12/22 0704  WBC 12.3* 6.3 7.6 4.6  NEUTROABS 10.5*  --  5.1 2.2  HGB 12.0 9.8* 9.7* 9.5*  HCT 37.0 30.7* 30.5* 29.6*  MCV 91.8 94.2 94.7 94.6  PLT 203 165 165 174   Cardiac Enzymes: No results for input(s): "CKTOTAL", "CKMB", "CKMBINDEX", "TROPONINI" in the last 168 hours. BNP: BNP (last 3 results) No results for input(s): "BNP" in the last 8760 hours.  ProBNP (last 3 results) No results for input(s): "PROBNP" in the last 8760 hours.  CBG: No results for input(s): "GLUCAP" in the last 168 hours.     Signed:  Rhetta Mura MD   [facility] 10/12/2022, 10:27 AM

## 2022-10-12 NOTE — Plan of Care (Signed)
  Problem: Education: Goal: Knowledge of General Education information will improve Description: Including pain rating scale, medication(s)/side effects and non-pharmacologic comfort measures Outcome: Progressing   Problem: Education: Goal: Knowledge of General Education information will improve Description: Including pain rating scale, medication(s)/side effects and non-pharmacologic comfort measures Outcome: Progressing   Problem: Health Behavior/Discharge Planning: Goal: Ability to manage health-related needs will improve Outcome: Progressing   Problem: Health Behavior/Discharge Planning: Goal: Ability to manage health-related needs will improve Outcome: Progressing   Problem: Clinical Measurements: Goal: Ability to maintain clinical measurements within normal limits will improve Outcome: Progressing Goal: Will remain free from infection Outcome: Progressing Goal: Diagnostic test results will improve Outcome: Progressing Goal: Cardiovascular complication will be avoided Outcome: Progressing   Problem: Clinical Measurements: Goal: Ability to maintain clinical measurements within normal limits will improve Outcome: Progressing   Problem: Clinical Measurements: Goal: Will remain free from infection Outcome: Progressing   Problem: Clinical Measurements: Goal: Diagnostic test results will improve Outcome: Progressing   Problem: Clinical Measurements: Goal: Cardiovascular complication will be avoided Outcome: Progressing   Problem: Activity: Goal: Risk for activity intolerance will decrease Outcome: Progressing   Problem: Activity: Goal: Risk for activity intolerance will decrease Outcome: Progressing   Problem: Nutrition: Goal: Adequate nutrition will be maintained Outcome: Progressing   Problem: Nutrition: Goal: Adequate nutrition will be maintained Outcome: Progressing   Problem: Coping: Goal: Level of anxiety will decrease Outcome: Progressing    Problem: Coping: Goal: Level of anxiety will decrease Outcome: Progressing   Problem: Elimination: Goal: Will not experience complications related to bowel motility Outcome: Progressing Goal: Will not experience complications related to urinary retention Outcome: Progressing   Problem: Elimination: Goal: Will not experience complications related to bowel motility Outcome: Progressing   Problem: Elimination: Goal: Will not experience complications related to urinary retention Outcome: Progressing   Problem: Pain Managment: Goal: General experience of comfort will improve Outcome: Progressing   Problem: Pain Managment: Goal: General experience of comfort will improve Outcome: Progressing   Problem: Safety: Goal: Ability to remain free from injury will improve Outcome: Progressing   Problem: Safety: Goal: Ability to remain free from injury will improve Outcome: Progressing   Problem: Skin Integrity: Goal: Risk for impaired skin integrity will decrease Outcome: Progressing   Problem: Skin Integrity: Goal: Risk for impaired skin integrity will decrease Outcome: Progressing

## 2022-10-12 NOTE — Progress Notes (Signed)
Patient seen at bedside.  Husband at bedside.  Patient is obviously frustrated because of post polypectomy bleeding.  I spent more than 15 minutes in the patient's room discussing polypectomy and polypectomy related complication including bleeding and how we manage it.  Patient had a colonoscopy few days ago with removal of large rectosigmoid polyp. She had a bleeding on the same day of colonoscopy after she went home.  Initially Dr. Dulce Sellar discussed with her and advised to go to the emergency room if she has ongoing bleeding.  Subsequently I called her and informed that we can get her in for flex sig and clip specimen next day.  Looks like, because of ongoing bleeding she came to the emergency department and got admitted.  She was found to have significant anemia from acute blood loss.  Dr. Bosie Clos did flex sig on August 15 with clips placement on 2 post polypectomy sites.  Her bleeding has resolved now.  She was understandably very frustrated.  We discussed multiple scenarios after polypectomy.  I did discuss that she did not had any high risk stigmata after polypectomy to predict postpolypectomy bleeding and to put a clip at the time initial of colonoscopy.   I apologize for colonoscopy related complication.  We prayed together.  Patient will follow-up with my partner Dr. Bosie Clos for future colonoscopies.  Advance diet as tolerated.   If patient remains in hospital, I will follow-up.  Kathi Der MD, FACP 10/12/2022, 9:57 AM  Contact #  [phone]

## 2022-10-14 ENCOUNTER — Encounter (HOSPITAL_COMMUNITY): Payer: Self-pay | Admitting: Gastroenterology

## 2022-11-20 ENCOUNTER — Other Ambulatory Visit: Payer: Self-pay | Admitting: Obstetrics and Gynecology

## 2022-11-20 DIAGNOSIS — M545 Low back pain, unspecified: Secondary | ICD-10-CM

## 2022-11-22 ENCOUNTER — Ambulatory Visit
Admission: RE | Admit: 2022-11-22 | Discharge: 2022-11-22 | Disposition: A | Discharge status: Home / Self Care | Payer: 59 | Source: Ambulatory Visit | Attending: Obstetrics and Gynecology | Admitting: Obstetrics and Gynecology

## 2022-11-22 ENCOUNTER — Other Ambulatory Visit: Payer: Self-pay | Admitting: Obstetrics and Gynecology

## 2022-11-22 DIAGNOSIS — Z1231 Encounter for screening mammogram for malignant neoplasm of breast: Secondary | ICD-10-CM

## 2022-11-22 DIAGNOSIS — M545 Low back pain, unspecified: Secondary | ICD-10-CM

## 2022-11-27 ENCOUNTER — Inpatient Hospital Stay
Admission: RE | Admit: 2022-11-27 | Discharge: 2022-11-27 | Discharge status: Home / Self Care | Payer: 59 | Source: Ambulatory Visit

## 2022-11-27 DIAGNOSIS — Z1231 Encounter for screening mammogram for malignant neoplasm of breast: Secondary | ICD-10-CM

## 2023-02-11 ENCOUNTER — Encounter: Payer: Self-pay | Admitting: Podiatry

## 2023-02-11 ENCOUNTER — Ambulatory Visit: Payer: 59 | Admitting: Podiatry

## 2023-02-11 DIAGNOSIS — B351 Tinea unguium: Secondary | ICD-10-CM

## 2023-02-11 DIAGNOSIS — L601 Onycholysis: Secondary | ICD-10-CM

## 2023-02-11 MED ORDER — TERBINAFINE HCL 250 MG PO TABS: 250.0000 mg | ORAL_TABLET | Freq: Every day | ORAL | 0 refills | Status: AC

## 2023-02-11 NOTE — Progress Notes (Signed)
  Subjective:  Patient ID: Jo [name], female    DOB: 09-Jan-1976,  MRN: [id number]  Chief Complaint  Patient presents with   Nail Problem    Left foot , she has had pain in the big toe, she feels the nail may be coming off, no injury that she is aware of.    47 y.o. female presents with the above complaint. History confirmed with patient.   Objective:  Physical Exam: warm, good capillary refill, no trophic changes or ulcerative lesions, normal DP and PT pulses, normal sensory exam, and onycholysis of left hallux nail with yellow-brown discoloration.  Assessment:   1. Onycholysis   2. Onychomycosis      Plan:  Patient was evaluated and treated and all questions answered.  I discussed with her that my impression clinically of her nails there is onycholysis we do not have a specific traumatic event causing this and there is likely onychomycosis causing or contributing to this.  I recommended avulsion and removal of the nail plate.  We also discussed treating with oral antifungal therapy with Lamisil.  I recommended a 63-month course.  Following consent and sterile prep with Betadine the toe was exsanguinated and a tourniquet placed around the base of the toe.  The nail plate was avulsed the nailbed was cleansed with saline and a sterile bandage was applied.  Post care instructions given.  Follow-up in 3 months to reevaluate.  Return in about 3 months (around 05/12/2023) for follow up after nail removal L great toe and fungus treatment.

## 2023-02-11 NOTE — Patient Instructions (Signed)
Soak Instructions ° ° ° °THE DAY AFTER THE PROCEDURE ° °Place 1/4 cup of epsom salts (or betadine, or white vinegar) in a quart of warm tap water.  Submerge your foot or feet with outer bandage intact for the initial soak; this will allow the bandage to become moist and wet for easy lift off.  Once you remove your bandage, continue to soak in the solution for 20 minutes.  This soak should be done twice a day.  Next, remove your foot or feet from solution, blot dry the affected area and cover.  You may use a band aid large enough to cover the area or use gauze and tape.  Apply other medications to the area as directed by the doctor such as polysporin neosporin. ° °IF YOUR SKIN BECOMES IRRITATED WHILE USING THESE INSTRUCTIONS, IT IS OKAY TO SWITCH TO  WHITE VINEGAR AND WATER. Or you may use antibacterial soap and water to keep the toe clean ° °Monitor for any signs/symptoms of infection. Call the office immediately if any occur or go directly to the emergency room. Call with any questions/concerns. ° ° °

## 2023-02-13 ENCOUNTER — Encounter: Payer: Self-pay | Admitting: Podiatry

## 2023-04-21 ENCOUNTER — Telehealth: Payer: Self-pay | Admitting: Podiatry

## 2023-04-21 NOTE — Telephone Encounter (Signed)
 Completed (2) STD paperwork packages from [facility] for both the patient and her spouse Melvyn Neth) ....  Patient does not work outside the home and her spouse was off for two days (02/12/23 & 02/13/23) taking care of her and her needs ....   Faxed both to [phone] ....   Called the patient and advised the same .Marland Kitchen...    J. Abbott -- 04/21/2023

## 2023-05-06 ENCOUNTER — Ambulatory Visit: Payer: 59 | Admitting: Podiatry

## 2023-05-06 ENCOUNTER — Encounter: Payer: Self-pay | Admitting: Podiatry

## 2023-05-06 DIAGNOSIS — L601 Onycholysis: Secondary | ICD-10-CM

## 2023-05-08 NOTE — Progress Notes (Signed)
  Subjective:  Patient ID: Valora Corporal, female    DOB: [birth year],  MRN: [id number]  Chief Complaint  Patient presents with   Follow-up    Patient states that everything has been going great , no drainage no pain .     48 y.o. female presents with the above complaint. History confirmed with patient.  Doing well she is pleased with the regrowth  Objective:  Physical Exam: warm, good capillary refill, no trophic changes or ulcerative lesions, normal DP and PT pulses, normal sensory exam, and has approximate 75% regrowth of nail with clearance of onychomycosis.  Assessment:   1. Onycholysis      Plan:  Patient was evaluated and treated and all questions answered.  Doing quite well and has good regrowth.  Minimal dystrophy distally.  She may resume pedicures as needed.  Expect this likely will take another 6 months or so to fully regrow and have normal nasal appearance.  Return to see me as needed.  No follow-ups on file.
